# Patient Record
Sex: Male | Born: 1965 | Race: White | Hispanic: No | Marital: Married | Smoking: Never smoker
Health system: Southern US, Community
[De-identification: ages and names within clinical notes are randomized; demographics above are authoritative.]

## PROBLEM LIST (undated history)

## (undated) DIAGNOSIS — D649 Anemia, unspecified: Secondary | ICD-10-CM

## (undated) DIAGNOSIS — Z87442 Personal history of urinary calculi: Secondary | ICD-10-CM

## (undated) DIAGNOSIS — I10 Essential (primary) hypertension: Secondary | ICD-10-CM

## (undated) DIAGNOSIS — G4730 Sleep apnea, unspecified: Secondary | ICD-10-CM

## (undated) HISTORY — PX: HERNIA REPAIR: SHX51

## (undated) HISTORY — PX: SHOULDER ARTHROSCOPY: SHX128

## (undated) HISTORY — PX: PROSTATE BIOPSY: SHX241

---

## 2016-09-14 DIAGNOSIS — M109 Gout, unspecified: Secondary | ICD-10-CM | POA: Diagnosis not present

## 2017-06-04 DIAGNOSIS — G4733 Obstructive sleep apnea (adult) (pediatric): Secondary | ICD-10-CM | POA: Diagnosis not present

## 2017-07-12 DIAGNOSIS — M109 Gout, unspecified: Secondary | ICD-10-CM | POA: Diagnosis not present

## 2017-07-12 DIAGNOSIS — M25522 Pain in left elbow: Secondary | ICD-10-CM | POA: Diagnosis not present

## 2018-04-17 DIAGNOSIS — J00 Acute nasopharyngitis [common cold]: Secondary | ICD-10-CM | POA: Diagnosis not present

## 2018-10-28 ENCOUNTER — Other Ambulatory Visit (HOSPITAL_BASED_OUTPATIENT_CLINIC_OR_DEPARTMENT_OTHER): Payer: Self-pay | Admitting: Nurse Practitioner

## 2018-10-28 DIAGNOSIS — R748 Abnormal levels of other serum enzymes: Secondary | ICD-10-CM

## 2018-11-05 ENCOUNTER — Ambulatory Visit (HOSPITAL_BASED_OUTPATIENT_CLINIC_OR_DEPARTMENT_OTHER)
Admission: RE | Admit: 2018-11-05 | Discharge: 2018-11-05 | Disposition: A | Discharge status: Home / Self Care | Payer: 59 | Source: Ambulatory Visit | Attending: Nurse Practitioner | Admitting: Nurse Practitioner

## 2018-11-05 ENCOUNTER — Other Ambulatory Visit: Payer: Self-pay

## 2018-11-05 DIAGNOSIS — R748 Abnormal levels of other serum enzymes: Secondary | ICD-10-CM | POA: Diagnosis not present

## 2020-09-03 ENCOUNTER — Other Ambulatory Visit (HOSPITAL_BASED_OUTPATIENT_CLINIC_OR_DEPARTMENT_OTHER): Payer: Self-pay | Admitting: Family Medicine

## 2020-09-03 ENCOUNTER — Ambulatory Visit (HOSPITAL_BASED_OUTPATIENT_CLINIC_OR_DEPARTMENT_OTHER)
Admission: RE | Admit: 2020-09-03 | Discharge: 2020-09-03 | Disposition: A | Discharge status: Home / Self Care | Payer: Commercial Managed Care - PPO | Source: Ambulatory Visit | Attending: Family Medicine | Admitting: Family Medicine

## 2020-09-03 ENCOUNTER — Encounter (HOSPITAL_BASED_OUTPATIENT_CLINIC_OR_DEPARTMENT_OTHER): Payer: Self-pay | Admitting: Family Medicine

## 2020-09-03 ENCOUNTER — Other Ambulatory Visit: Payer: Self-pay

## 2020-09-03 DIAGNOSIS — R29810 Facial weakness: Secondary | ICD-10-CM | POA: Diagnosis not present

## 2020-09-03 MED ORDER — IOHEXOL 300 MG/ML  SOLN
75.0000 mL | Freq: Once | INTRAMUSCULAR | Status: AC | PRN
  Administered 2020-09-03: 75 mL via INTRAVENOUS

## 2020-12-02 ENCOUNTER — Ambulatory Visit (INDEPENDENT_AMBULATORY_CARE_PROVIDER_SITE_OTHER): Payer: Commercial Managed Care - PPO | Admitting: Podiatry

## 2020-12-02 ENCOUNTER — Other Ambulatory Visit: Payer: Self-pay

## 2020-12-02 DIAGNOSIS — M79674 Pain in right toe(s): Secondary | ICD-10-CM | POA: Diagnosis not present

## 2020-12-02 DIAGNOSIS — L03039 Cellulitis of unspecified toe: Secondary | ICD-10-CM

## 2020-12-02 NOTE — Patient Instructions (Signed)

## 2020-12-07 NOTE — Progress Notes (Signed)
Subjective:   Patient ID: [name], male   DOB: 55 y.o.   MRN: [id number]   HPI 55 year old male presents the office today with concerns of redness and swelling and discomfort of the big toe on the right side.  He states that the redness started after he jammed the toe under a cabinet and scraped some skin.  Eventually noticed a water blister.  He developed swelling or redness.  Originally thought there was a callus on indomethacin as well as colchicine without any improvement.  Also has been on a round of Bactrim which did not help.  There was on a prednisone taper as well as amoxicillin without significant improvement.  Still has redness and swelling to the toe.  He has not seen any purulence recently.  No other concerns.   Review of Systems  All other systems reviewed and are negative.  No past medical history on file.    No current outpatient medications on file.  Not on File       Objective:  Physical Exam  General: AAO x3, NAD  Dermatological: To the right hallux toenail with localized edema and erythema to the proximal nail fold.  There is old dried drainage noted underneath the proximal nail fold.  There is no ascending cellulitis.  The nail appears to be dystrophic.  There is no purulence.  No fluctuance or crepitation.  Vascular: Dorsalis Pedis artery and Posterior Tibial artery pedal pulses are 2/4 bilateral with immedate capillary fill time.  There is no pain with calf compression, swelling, warmth, erythema.   Neruologic: Grossly intact via light touch bilateral.  Musculoskeletal: Mild tenderness right hallux proximal nail fold.  Muscular strength 5/5 in all groups tested bilateral.  Gait: Unassisted, Nonantalgic.       Assessment:   Right hallux toenail infection     Plan:  -Treatment options discussed including all alternatives, risks, and complications -Etiology of symptoms were discussed -At this point has been on numerous antibiotics as well as  Medication not significant improvement.  At this point I recommended total nail avulsion given the infection on the proximal nail fold.  I discussion he agrees to proceed with this. -At this time, recommended total nail removal without chemical matricectomy to the right hallux due to infection. Risks and complications were discussed with the patient for which they understand and  verbally consent to the procedure. Under sterile conditions a total of 3 mL of a mixture of 2% lidocaine plain and 0.5% Marcaine plain was infiltrated in a hallux block fashion. Once anesthetized, the skin was prepped in sterile fashion. A tourniquet was then applied. Next the right hallux nail was then excised in total making sure to remove the entire nail borders. Once the nail was  Removed, the area was debrided and the underlying skin was intact. The area was irrigated and hemostasis was obtained.  A dry sterile dressing was applied. After application of the dressing the tourniquet was removed and there is found to be an immediate capillary refill time to the digit. The patient tolerated the procedure well any complications. Post procedure instructions were discussed the patient for which he verbally understood. Follow-up in one week for nail check or sooner if any problems are to arise. Discussed signs/symptoms of worsening infection and directed to call the office immediately should any occur or go directly to the emergency room. In the meantime, encouraged to call the office with any questions, concerns, changes symptoms.  Return in about 2 weeks (around 12/16/2020)  for nail check .  Vivi Barrack DPM

## 2020-12-16 ENCOUNTER — Other Ambulatory Visit: Payer: Self-pay

## 2020-12-16 ENCOUNTER — Ambulatory Visit (INDEPENDENT_AMBULATORY_CARE_PROVIDER_SITE_OTHER): Payer: Commercial Managed Care - PPO | Admitting: Podiatry

## 2020-12-16 DIAGNOSIS — L03039 Cellulitis of unspecified toe: Secondary | ICD-10-CM

## 2020-12-16 DIAGNOSIS — M79674 Pain in right toe(s): Secondary | ICD-10-CM

## 2020-12-16 NOTE — Progress Notes (Signed)
Subjective: [name] is a 55 y.o.  male returns to office today for follow up evaluation after having right total hallux nail avulsion performed.  Said he was doing well and soaking his foot in Epson salts but the area is scabbed over and not had any swelling or redness or any drainage.  During this period patient denies fevers, chills, nausea, vomiting. Denies any calf pain, chest pain, SOB.   Objective:   General: Well developed, nourished, in no acute distress, alert and oriented x3   Dermatology: Skin is warm, dry and supple bilateral.  Right hallux nail border appears to be clean, dry, with mild minimal scab present.  There is no surrounding erythema, edema, drainage/purulence. The remaining nails appear unremarkable at this time. There are no other lesions or other signs of infection present.  Neurovascular status: Intact. No lower extremity swelling; No pain with calf compression bilateral.  Musculoskeletal: No tenderness to palpation of the right hallux nail fold. Muscular strength within normal limits bilateral.   Assesement and Plan: S/p partial nail avulsion, doing well.   -Recommend still keep covered with antibiotic ointment and a Band-Aid during the day.  Can leave the area open at night. -If the area has not healed in 2 weeks, call the office for follow-up appointment, or sooner if any problems arise.  -Monitor for any signs/symptoms of infection. Call the office immediately if any occur or go directly to the emergency room. Call with any questions/concerns.  Ovid Curd, DPM

## 2021-08-12 ENCOUNTER — Encounter: Payer: Self-pay | Admitting: Podiatry

## 2021-08-12 ENCOUNTER — Ambulatory Visit (INDEPENDENT_AMBULATORY_CARE_PROVIDER_SITE_OTHER): Payer: Commercial Managed Care - PPO | Admitting: Podiatry

## 2021-08-12 DIAGNOSIS — L600 Ingrowing nail: Secondary | ICD-10-CM | POA: Diagnosis not present

## 2021-08-12 NOTE — Progress Notes (Signed)
  Subjective:  Patient ID: [name], male    DOB: Jul 08, 1965,   MRN: [id number]  Chief Complaint  Patient presents with   Ingrown Toenail     L Big toe ingrown toenail, blisters on tip of toes    56 y.o. male presents for concern of left great toe possible ingrown that has been ongoing for weeks. Relates he has tried to trim it but keeps coming back. Relates redness and pain in the toe and a blister on the tip. Has been soaking as well.  . Denies any other pedal complaints. Denies n/v/f/c.   No past medical history on file.  Objective:  Physical Exam: Vascular: DP/PT pulses 2/4 bilateral. CFT <3 seconds. Normal hair growth on digits. No edema.  Skin. No lacerations or abrasions bilateral feet. Left hallux nails bilateral borders incurvated with midl edema and erythema surrounding. No purulence.  Musculoskeletal: MMT 5/5 bilateral lower extremities in DF, PF, Inversion and Eversion. Deceased ROM in DF of ankle joint.  Neurological: Sensation intact to light touch.   Assessment:   1. Ingrown nail of great toe of left foot      Plan:  Patient was evaluated and treated and all questions answered. Patient requesting removal of ingrown nail today. Procedure below.  Discussed procedure and post procedure care and patient expressed understanding.  Will follow-up in 2 weeks for nail check or sooner if any problems arise.    Procedure:  Procedure: partial Nail Avulsion of left hallux bilateral nail border.  Surgeon: Louann Sjogren, DPM  Pre-op Dx: Ingrown toenail with and without infection Post-op: Same  Place of Surgery: Office exam room.  Indications for surgery: Painful and ingrown toenail.    The patient is requesting removal of nail with chemical matrixectomy. Risks and complications were discussed with the patient for which they understand and written consent was obtained. Under sterile conditions a total of 3 mL of  1% lidocaine plain was infiltrated in a hallux block  fashion. Once anesthetized, the skin was prepped in sterile fashion. A tourniquet was then applied. Next the bilateral aspect of hallux nail border was then sharply excised making sure to remove the entire offending nail border.  Next phenol was then applied under standard conditions and copiously irrigated. Silvadene was applied. A dry sterile dressing was applied. After application of the dressing the tourniquet was removed and there is found to be an immediate capillary refill time to the digit. The patient tolerated the procedure well without any complications. Post procedure instructions were discussed the patient for which he verbally understood. Follow-up in two weeks for nail check or sooner if any problems are to arise. Discussed signs/symptoms of infection and directed to call the office immediately should any occur or go directly to the emergency room. In the meantime, encouraged to call the office with any questions, concerns, changes symptoms.   Louann Sjogren, DPM

## 2021-08-12 NOTE — Patient Instructions (Addendum)

## 2021-09-01 ENCOUNTER — Ambulatory Visit (INDEPENDENT_AMBULATORY_CARE_PROVIDER_SITE_OTHER): Payer: Commercial Managed Care - PPO | Admitting: Podiatry

## 2021-09-01 ENCOUNTER — Encounter: Payer: Self-pay | Admitting: Podiatry

## 2021-09-01 DIAGNOSIS — L600 Ingrowing nail: Secondary | ICD-10-CM

## 2021-09-01 NOTE — Patient Instructions (Signed)

## 2021-09-01 NOTE — Progress Notes (Signed)
  Subjective:  Patient ID: [name], male    DOB: 1965-08-06,   MRN: [id number]  Chief Complaint  Patient presents with   Ingrown Toenail    Left great toe ingrown nail - patient is now concern about his right great toe     56 y.o. male presents for concern follow-up of left great ingrown nail procedure. Relates the nail is better and feels better. Has been soaking and doing neosporin. Now has concern for right great toenail having an ingrown. Wanting this evaluated.  . Denies any other pedal complaints. Denies n/v/f/c.   History reviewed. No pertinent past medical history.  Objective:  Physical Exam: Vascular: DP/PT pulses 2/4 bilateral. CFT <3 seconds. Normal hair growth on digits. No edema.  Skin. No lacerations or abrasions bilateral feet. Left hallux nail healing well. Right hallux with medial and lateral border incurvation and tenderness to medial border. Mild edema noted no erythema.  Musculoskeletal: MMT 5/5 bilateral lower extremities in DF, PF, Inversion and Eversion. Deceased ROM in DF of ankle joint.  Neurological: Sensation intact to light touch.   Assessment:   1. Ingrown right greater toenail      Plan:  Patient was evaluated and treated and all questions answered. Patient requesting removal of ingrown nail today. Procedure below.  Discussed procedure and post procedure care and patient expressed understanding.  Will follow-up in 2 weeks for nail check or sooner if any problems arise.    Procedure:  Procedure: partial Nail Avulsion of right hallux bilateral nail border.  Surgeon: Louann Sjogren, DPM  Pre-op Dx: Ingrown toenail without infection Post-op: Same  Place of Surgery: Office exam room.  Indications for surgery: Painful and ingrown toenail.    The patient is requesting removal of nail with chemical matrixectomy. Risks and complications were discussed with the patient for which they understand and written consent was obtained. Under sterile  conditions a total of 3 mL of  1% lidocaine plain was infiltrated in a hallux block fashion. Once anesthetized, the skin was prepped in sterile fashion. A tourniquet was then applied. Next the bilateral aspect of hallux nail border was then sharply excised making sure to remove the entire offending nail border.  Next phenol was then applied under standard conditions and copiously irrigated. Silvadene was applied. A dry sterile dressing was applied. After application of the dressing the tourniquet was removed and there is found to be an immediate capillary refill time to the digit. The patient tolerated the procedure well without any complications. Post procedure instructions were discussed the patient for which he verbally understood. Follow-up in two weeks for nail check or sooner if any problems are to arise. Discussed signs/symptoms of infection and directed to call the office immediately should any occur or go directly to the emergency room. In the meantime, encouraged to call the office with any questions, concerns, changes symptoms.   Louann Sjogren, DPM

## 2021-09-15 ENCOUNTER — Ambulatory Visit: Payer: Commercial Managed Care - PPO | Admitting: Podiatry

## 2022-05-01 ENCOUNTER — Other Ambulatory Visit: Payer: Self-pay | Admitting: Urology

## 2022-05-01 DIAGNOSIS — R972 Elevated prostate specific antigen [PSA]: Secondary | ICD-10-CM

## 2022-05-03 IMAGING — CT CT HEAD WO/W CM
3 of 6 series · 16 of 47 positions shown, 19 images · IV contrast (Omnipaque)
Comparison: CT head 09/03/2020

CLINICAL DATA: Left-sided numbness.

EXAM:
CT HEAD WITHOUT AND WITH CONTRAST
TECHNIQUE: Contiguous axial images were obtained from the base of the skull
through the vertex without and with intravenous contrast
CONTRAST:  75mL OMNIPAQUE IOHEXOL 300 MG/ML  SOLN

[Series 2: head wo · axial · 0.46mm/px · z∈[+809,+944]mm · 11 of 33 slices shown, 14 images]
[im 3/33  brain]
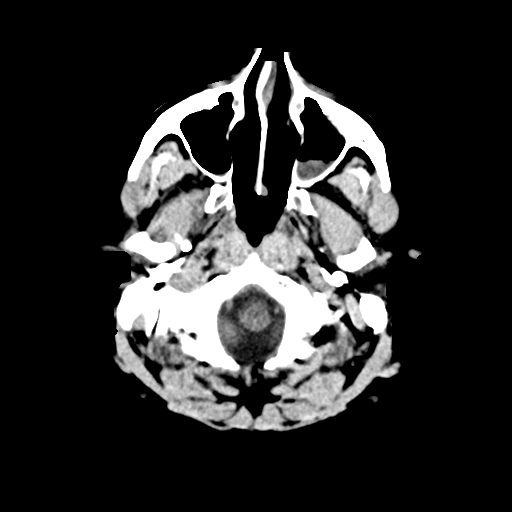
[im 3/33  bone]
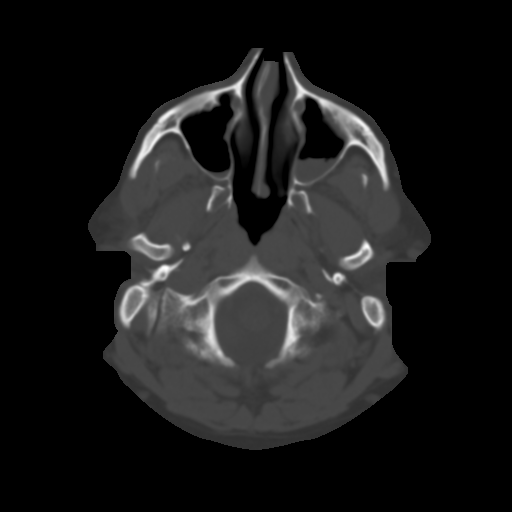
[im 5/33  brain]
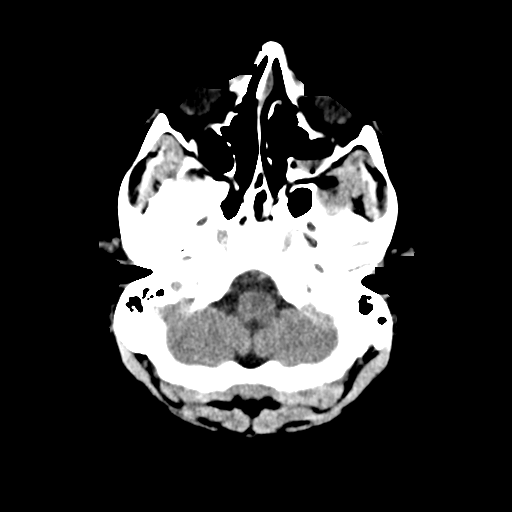
[im 7/33  brain]
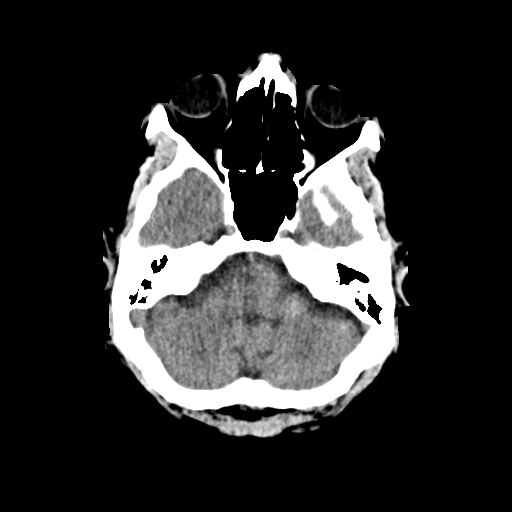
[im 12/33  brain]
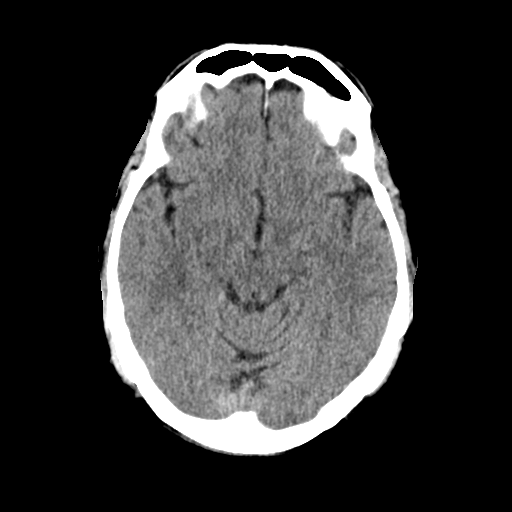
[im 14/33  brain]
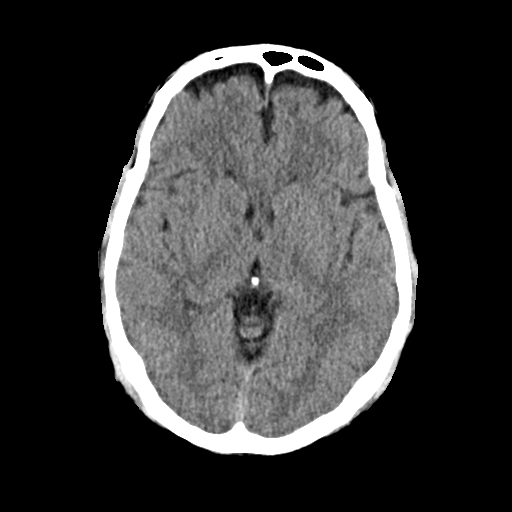
[im 14/33  bone]
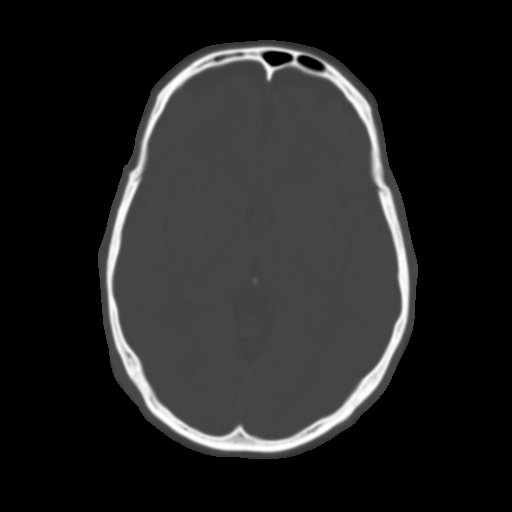
[im 17/33  brain]
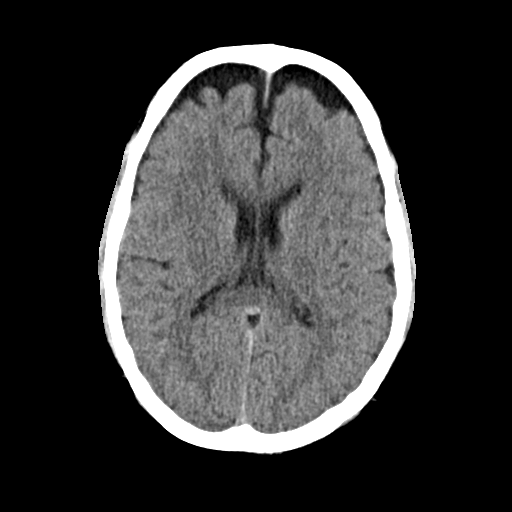
[im 19/33  brain]
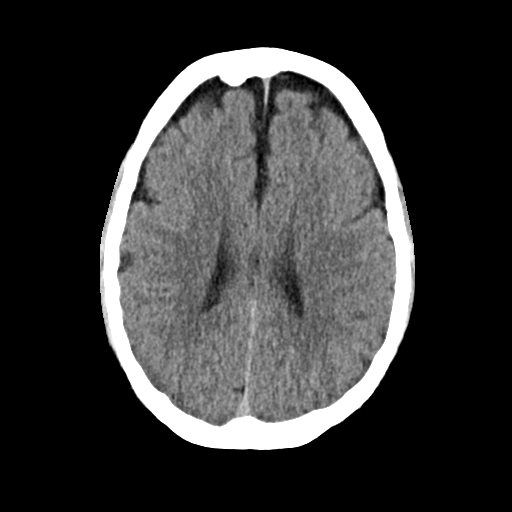
[im 21/33  brain]
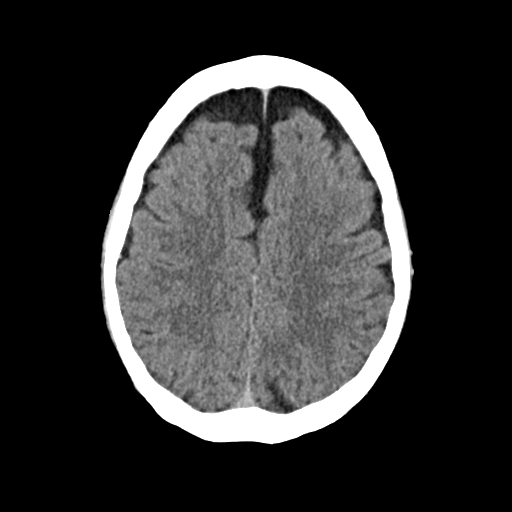
[im 26/33  brain]
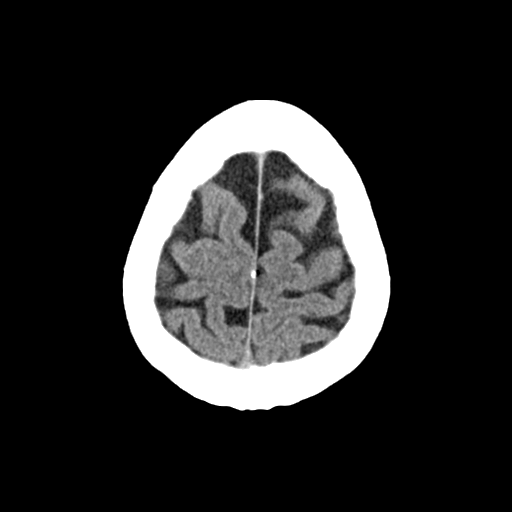
[im 26/33  bone]
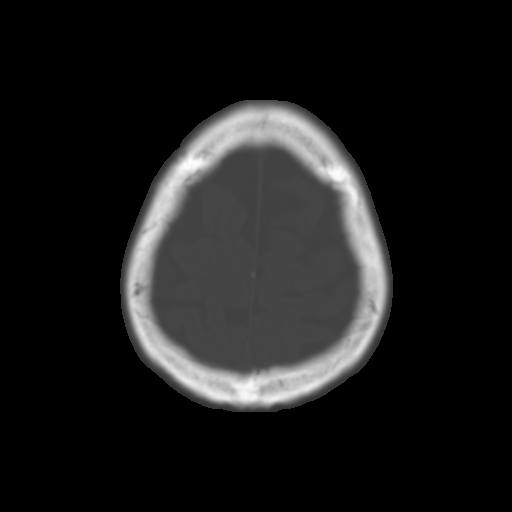
[im 28/33  brain]
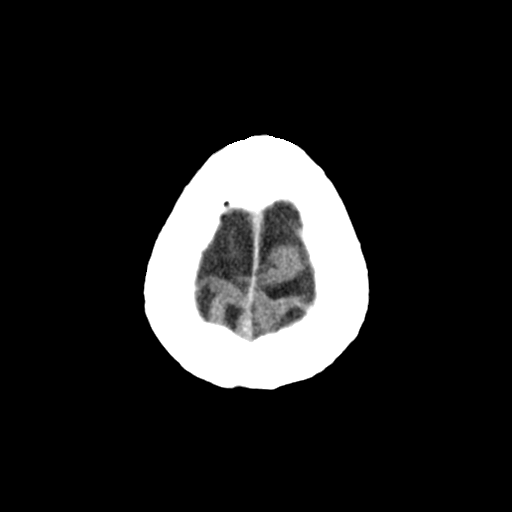
[im 30/33  brain]
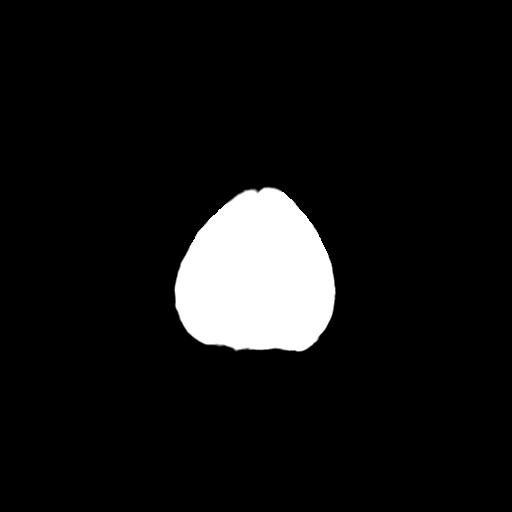

[Series 4: cor wo soft · coronal · 0.33mm/px · 3 of 71 slices shown]
[im 18/71  brain]
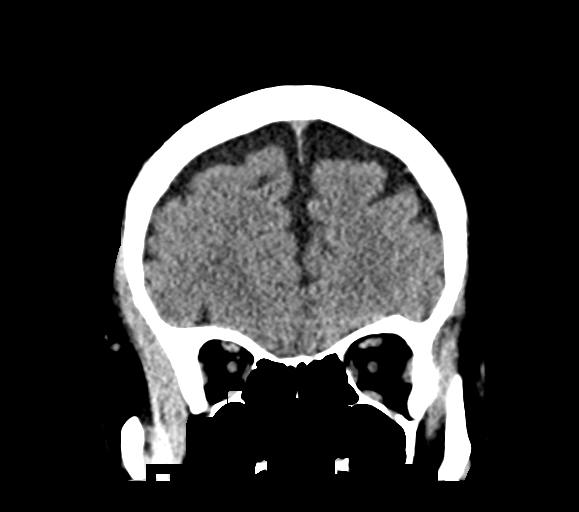
[im 36/71  brain]
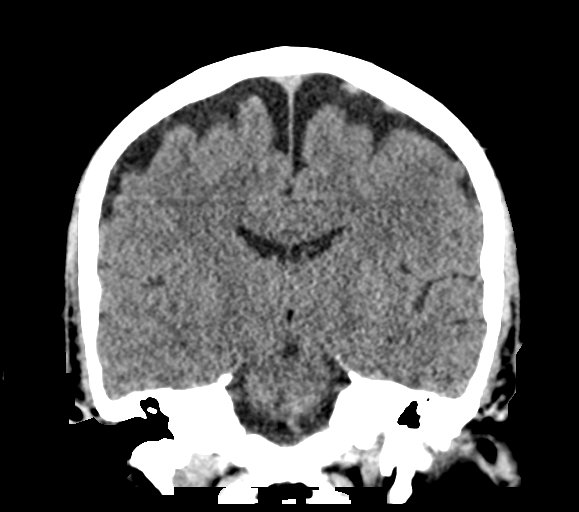
[im 53/71  brain]
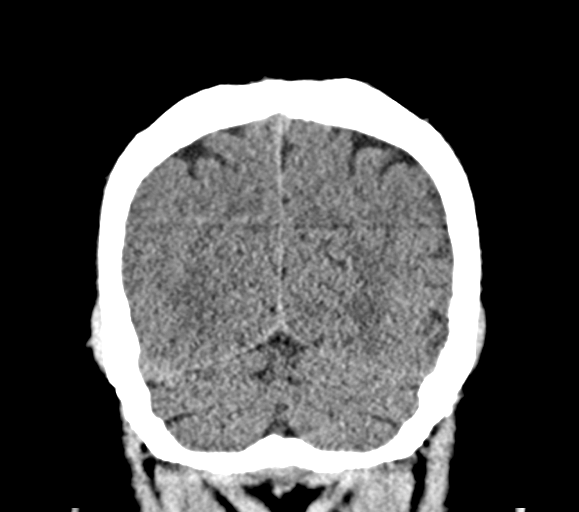

[Series 5: sag wo soft · sagittal · 0.35mm/px · 2 of 59 slices shown]
[im 20/59  brain]
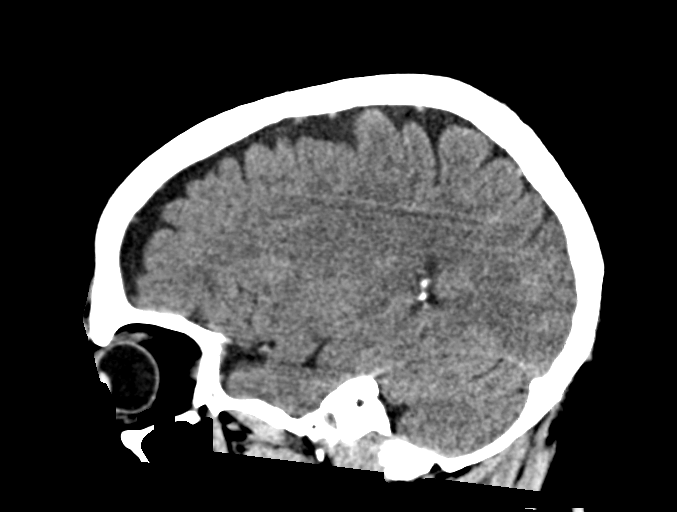
[im 39/59  brain]
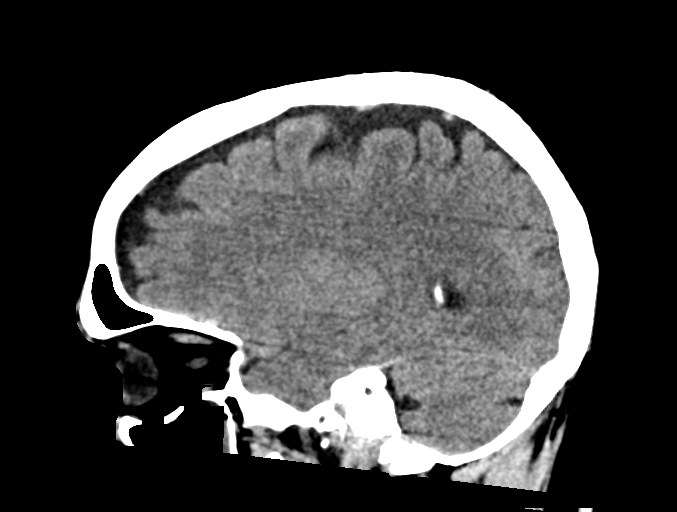

[16 of 47 positions shown; findings below may reference images not displayed]

FINDINGS: Brain: No evidence of acute infarction, hemorrhage, hydrocephalus,
extra-axial collection or mass lesion/mass effect.

Vascular: Negative for hyperdense vessel

Skull: Negative

Sinuses/Orbits: Mucosal edema and air-fluid levels in the maxillary
sinus bilaterally. Negative orbit

Other: None
IMPRESSION: Negative CT head

Mucosal edema and air-fluid levels in the maxillary sinus
bilaterally

## 2022-05-25 ENCOUNTER — Ambulatory Visit
Admission: RE | Admit: 2022-05-25 | Discharge: 2022-05-25 | Disposition: A | Discharge status: Home / Self Care | Payer: Commercial Managed Care - PPO | Source: Ambulatory Visit | Attending: Urology | Admitting: Urology

## 2022-05-25 DIAGNOSIS — R972 Elevated prostate specific antigen [PSA]: Secondary | ICD-10-CM

## 2022-05-25 MED ORDER — GADOPICLENOL 0.5 MMOL/ML IV SOLN
10.0000 mL | Freq: Once | INTRAVENOUS | Status: AC | PRN
  Administered 2022-05-25: 10 mL via INTRAVENOUS

## 2023-03-09 ENCOUNTER — Other Ambulatory Visit: Payer: Self-pay

## 2023-03-09 ENCOUNTER — Observation Stay (HOSPITAL_BASED_OUTPATIENT_CLINIC_OR_DEPARTMENT_OTHER): Payer: Commercial Managed Care - PPO | Admitting: Certified Registered Nurse Anesthetist

## 2023-03-09 ENCOUNTER — Encounter (HOSPITAL_COMMUNITY): Payer: Self-pay | Admitting: Urology

## 2023-03-09 ENCOUNTER — Encounter (HOSPITAL_COMMUNITY)
Admission: EM | Disposition: A | Discharge status: Home / Self Care | Payer: Self-pay | Source: Home / Self Care | Attending: Emergency Medicine

## 2023-03-09 ENCOUNTER — Observation Stay (HOSPITAL_COMMUNITY): Payer: Commercial Managed Care - PPO | Admitting: Certified Registered Nurse Anesthetist

## 2023-03-09 ENCOUNTER — Observation Stay (HOSPITAL_COMMUNITY)
Admission: EM | Admit: 2023-03-09 | Discharge: 2023-03-10 | Disposition: A | Discharge status: Home / Self Care | Payer: Commercial Managed Care - PPO | Attending: Family Medicine | Admitting: Family Medicine

## 2023-03-09 ENCOUNTER — Observation Stay (HOSPITAL_COMMUNITY): Payer: Commercial Managed Care - PPO

## 2023-03-09 DIAGNOSIS — R339 Retention of urine, unspecified: Principal | ICD-10-CM

## 2023-03-09 DIAGNOSIS — I10 Essential (primary) hypertension: Secondary | ICD-10-CM | POA: Diagnosis not present

## 2023-03-09 DIAGNOSIS — Z79899 Other long term (current) drug therapy: Secondary | ICD-10-CM | POA: Diagnosis not present

## 2023-03-09 DIAGNOSIS — R338 Other retention of urine: Secondary | ICD-10-CM | POA: Diagnosis not present

## 2023-03-09 DIAGNOSIS — N401 Enlarged prostate with lower urinary tract symptoms: Secondary | ICD-10-CM | POA: Insufficient documentation

## 2023-03-09 DIAGNOSIS — R310 Gross hematuria: Principal | ICD-10-CM

## 2023-03-09 HISTORY — PX: C-CATH HEMA 3 WAY 30CC COUDE-2: 108846094

## 2023-03-09 HISTORY — DX: Sleep apnea, unspecified: G47.30

## 2023-03-09 HISTORY — DX: Essential (primary) hypertension: I10

## 2023-03-09 HISTORY — PX: CYSTOSCOPY WITH FULGERATION: SHX6638

## 2023-03-09 HISTORY — PX: HAND IRRIGATION W/ NORMAL SALINE: NUR10151

## 2023-03-09 LAB — COMPREHENSIVE METABOLIC PANEL
ALT: 35 U/L (ref 0–44)
AST: 31 U/L (ref 15–41)
Albumin: 3.6 g/dL (ref 3.5–5.0)
Alkaline Phosphatase: 39 U/L (ref 38–126)
Anion gap: 9 (ref 5–15)
BUN: 18 mg/dL (ref 6–20)
CO2: 22 mmol/L (ref 22–32)
Calcium: 8.9 mg/dL (ref 8.9–10.3)
Chloride: 102 mmol/L (ref 98–111)
Creatinine, Ser: 0.83 mg/dL (ref 0.61–1.24)
GFR, Estimated: >60 mL/min (ref >60)
Glucose, Bld: 156 mg/dL — ABNORMAL HIGH (ref 70–99)
Potassium: 3.8 mmol/L (ref 3.5–5.1)
Sodium: 133 mmol/L — ABNORMAL LOW (ref 135–145)
Total Bilirubin: 0.7 mg/dL (ref <1.2)
Total Protein: 7.2 g/dL (ref 6.5–8.1)

## 2023-03-09 LAB — CBC WITH DIFFERENTIAL/PLATELET
Abs Immature Granulocytes: 0.26 10*3/uL — ABNORMAL HIGH (ref 0.00–0.07)
Basophils Absolute: 0.1 10*3/uL (ref 0.0–0.1)
Basophils Relative: 0 %
Eosinophils Absolute: 0 10*3/uL (ref 0.0–0.5)
Eosinophils Relative: 0 %
HCT: 40.7 % (ref 39.0–52.0)
Hemoglobin: 13.9 g/dL (ref 13.0–17.0)
Immature Granulocytes: 1 %
Lymphocytes Relative: 7 %
Lymphs Abs: 1.4 10*3/uL (ref 0.7–4.0)
MCH: 32.3 pg (ref 26.0–34.0)
MCHC: 34.2 g/dL (ref 30.0–36.0)
MCV: 94.7 fL (ref 80.0–100.0)
Monocytes Absolute: 0.8 10*3/uL (ref 0.1–1.0)
Monocytes Relative: 4 %
Neutro Abs: 16.8 10*3/uL — ABNORMAL HIGH (ref 1.7–7.7)
Neutrophils Relative %: 88 %
Platelets: 318 10*3/uL (ref 150–400)
RBC: 4.3 MIL/uL (ref 4.22–5.81)
RDW: 12.6 % (ref 11.5–15.5)
WBC: 19.3 10*3/uL — ABNORMAL HIGH (ref 4.0–10.5)
nRBC: 0 % (ref 0.0–0.2)

## 2023-03-09 LAB — URINALYSIS, ROUTINE W REFLEX MICROSCOPIC

## 2023-03-09 LAB — URINALYSIS, MICROSCOPIC (REFLEX)
Bacteria, UA: NONE SEEN
RBC / HPF: >50 RBC/hpf (ref 0–5)
Squamous Epithelial / HPF: NONE SEEN /[HPF] (ref 0–5)

## 2023-03-09 LAB — HEMOGLOBIN AND HEMATOCRIT, BLOOD
HCT: 31.9 % — ABNORMAL LOW (ref 39.0–52.0)
Hemoglobin: 10.8 g/dL — ABNORMAL LOW (ref 13.0–17.0)

## 2023-03-09 LAB — HIV ANTIBODY (ROUTINE TESTING W REFLEX): HIV Screen 4th Generation wRfx: NONREACTIVE

## 2023-03-09 LAB — TYPE AND SCREEN
ABO/RH(D): A POS
Antibody Screen: NEGATIVE

## 2023-03-09 LAB — ABO/RH: ABO/RH(D): A POS

## 2023-03-09 SURGERY — CYSTOSCOPY, WITH BLADDER FULGURATION
Anesthesia: General

## 2023-03-09 MED ORDER — LACTATED RINGERS IV SOLN: INTRAVENOUS | Status: DC

## 2023-03-09 MED ORDER — CHLORHEXIDINE GLUCONATE 0.12 % MT SOLN
15.0000 mL | Freq: Once | OROMUCOSAL | Status: AC
  Administered 2023-03-09: 15 mL via OROMUCOSAL

## 2023-03-09 MED ORDER — FENTANYL CITRATE (PF) 100 MCG/2ML IJ SOLN
INTRAMUSCULAR | Status: AC
  Filled 2023-03-09: qty 2

## 2023-03-09 MED ORDER — MIDAZOLAM HCL 2 MG/2ML IJ SOLN
INTRAMUSCULAR | Status: AC
  Filled 2023-03-09: qty 2

## 2023-03-09 MED ORDER — MIDAZOLAM HCL 2 MG/2ML IJ SOLN
INTRAMUSCULAR | Status: DC | PRN
  Administered 2023-03-09: 2 mg via INTRAVENOUS

## 2023-03-09 MED ORDER — HYDRALAZINE HCL 20 MG/ML IJ SOLN: 5.0000 mg | Freq: Four times a day (QID) | INTRAMUSCULAR | Status: DC | PRN

## 2023-03-09 MED ORDER — ACETAMINOPHEN 10 MG/ML IV SOLN: 1000.0000 mg | Freq: Once | INTRAVENOUS | Status: DC | PRN

## 2023-03-09 MED ORDER — PHENYLEPHRINE 80 MCG/ML (10ML) SYRINGE FOR IV PUSH (FOR BLOOD PRESSURE SUPPORT)
PREFILLED_SYRINGE | INTRAVENOUS | Status: AC
  Filled 2023-03-09: qty 10

## 2023-03-09 MED ORDER — SODIUM CHLORIDE 0.9 % IV SOLN
3.0000 g | Freq: Once | INTRAVENOUS | Status: AC
  Administered 2023-03-09: 3 g via INTRAVENOUS

## 2023-03-09 MED ORDER — FENTANYL CITRATE PF 50 MCG/ML IJ SOSY
PREFILLED_SYRINGE | INTRAMUSCULAR | Status: AC
  Administered 2023-03-09: 25 ug via INTRAVENOUS
  Filled 2023-03-09: qty 2

## 2023-03-09 MED ORDER — ONDANSETRON HCL 4 MG/2ML IJ SOLN
4.0000 mg | Freq: Four times a day (QID) | INTRAMUSCULAR | Status: DC | PRN
Start: 2023-03-09 — End: 2023-03-10

## 2023-03-09 MED ORDER — EPHEDRINE 5 MG/ML INJ
INTRAVENOUS | Status: AC
  Filled 2023-03-09: qty 5

## 2023-03-09 MED ORDER — SODIUM CHLORIDE 0.9 % IR SOLN
Status: DC | PRN
  Administered 2023-03-09: 9000 mL via INTRAVESICAL

## 2023-03-09 MED ORDER — ALBUMIN HUMAN 5 % IV SOLN: 12.5000 g | Freq: Once | INTRAVENOUS | Status: AC

## 2023-03-09 MED ORDER — PHENYLEPHRINE 80 MCG/ML (10ML) SYRINGE FOR IV PUSH (FOR BLOOD PRESSURE SUPPORT)
PREFILLED_SYRINGE | INTRAVENOUS | Status: DC | PRN
  Administered 2023-03-09 (×2): 240 ug via INTRAVENOUS
  Administered 2023-03-09: 160 ug via INTRAVENOUS
  Administered 2023-03-09: 240 ug via INTRAVENOUS
  Administered 2023-03-09: 160 ug via INTRAVENOUS

## 2023-03-09 MED ORDER — MORPHINE SULFATE (PF) 4 MG/ML IV SOLN
4.0000 mg | Freq: Once | INTRAVENOUS | Status: AC
  Administered 2023-03-09: 4 mg via INTRAVENOUS
  Filled 2023-03-09: qty 1

## 2023-03-09 MED ORDER — ORAL CARE MOUTH RINSE: 15.0000 mL | Freq: Once | OROMUCOSAL | Status: AC

## 2023-03-09 MED ORDER — DEXAMETHASONE SODIUM PHOSPHATE 10 MG/ML IJ SOLN
INTRAMUSCULAR | Status: DC | PRN
  Administered 2023-03-09: 10 mg via INTRAVENOUS

## 2023-03-09 MED ORDER — CEFAZOLIN SODIUM 1 G IJ SOLR
INTRAMUSCULAR | Status: AC
  Filled 2023-03-09: qty 10

## 2023-03-09 MED ORDER — ONDANSETRON HCL 4 MG PO TABS: 4.0000 mg | ORAL_TABLET | Freq: Four times a day (QID) | ORAL | Status: DC | PRN

## 2023-03-09 MED ORDER — SODIUM CHLORIDE 0.9 % IR SOLN
3000.0000 mL | Status: DC
  Administered 2023-03-09: 3000 mL

## 2023-03-09 MED ORDER — ACETAMINOPHEN 325 MG PO TABS
650.0000 mg | ORAL_TABLET | Freq: Four times a day (QID) | ORAL | Status: DC | PRN
  Administered 2023-03-10 (×2): 650 mg via ORAL
  Filled 2023-03-09 (×3): qty 2

## 2023-03-09 MED ORDER — ACETAMINOPHEN 10 MG/ML IV SOLN
INTRAVENOUS | Status: AC
  Administered 2023-03-09: 1000 mg via INTRAVENOUS
  Filled 2023-03-09: qty 100

## 2023-03-09 MED ORDER — CEFAZOLIN SODIUM-DEXTROSE 2-4 GM/100ML-% IV SOLN
INTRAVENOUS | Status: AC
  Filled 2023-03-09: qty 100

## 2023-03-09 MED ORDER — ALBUTEROL SULFATE (2.5 MG/3ML) 0.083% IN NEBU: 2.5000 mg | INHALATION_SOLUTION | RESPIRATORY_TRACT | Status: DC | PRN

## 2023-03-09 MED ORDER — LIDOCAINE 2% (20 MG/ML) 5 ML SYRINGE
INTRAMUSCULAR | Status: DC | PRN
  Administered 2023-03-09: 60 mg via INTRAVENOUS

## 2023-03-09 MED ORDER — FENTANYL CITRATE PF 50 MCG/ML IJ SOSY
25.0000 ug | PREFILLED_SYRINGE | INTRAMUSCULAR | Status: DC | PRN
  Administered 2023-03-09: 25 ug via INTRAVENOUS

## 2023-03-09 MED ORDER — FENTANYL CITRATE (PF) 100 MCG/2ML IJ SOLN
INTRAMUSCULAR | Status: DC | PRN
  Administered 2023-03-09 (×2): 50 ug via INTRAVENOUS

## 2023-03-09 MED ORDER — HYDROCODONE-ACETAMINOPHEN 5-325 MG PO TABS
1.0000 | ORAL_TABLET | Freq: Once | ORAL | Status: AC
  Administered 2023-03-09: 1 via ORAL
  Filled 2023-03-09: qty 1

## 2023-03-09 MED ORDER — EPHEDRINE SULFATE-NACL 50-0.9 MG/10ML-% IV SOSY
PREFILLED_SYRINGE | INTRAVENOUS | Status: DC | PRN
  Administered 2023-03-09: 10 mg via INTRAVENOUS

## 2023-03-09 MED ORDER — SODIUM CHLORIDE 0.9 % IR SOLN
3000.0000 mL | Status: DC
  Administered 2023-03-09 (×3): 3000 mL

## 2023-03-09 MED ORDER — ROSUVASTATIN CALCIUM 10 MG PO TABS
10.0000 mg | ORAL_TABLET | Freq: Every day | ORAL | Status: DC
  Administered 2023-03-09: 10 mg via ORAL
  Filled 2023-03-09: qty 1

## 2023-03-09 MED ORDER — HYDROMORPHONE HCL 1 MG/ML IJ SOLN
0.5000 mg | Freq: Once | INTRAMUSCULAR | Status: AC
  Administered 2023-03-09: 0.5 mg via INTRAVENOUS

## 2023-03-09 MED ORDER — TRAZODONE HCL 50 MG PO TABS: 50.0000 mg | ORAL_TABLET | Freq: Every evening | ORAL | Status: DC | PRN

## 2023-03-09 MED ORDER — ONDANSETRON HCL 4 MG/2ML IJ SOLN
INTRAMUSCULAR | Status: DC | PRN
  Administered 2023-03-09: 4 mg via INTRAVENOUS

## 2023-03-09 MED ORDER — TAMSULOSIN HCL 0.4 MG PO CAPS
0.4000 mg | ORAL_CAPSULE | Freq: Every day | ORAL | Status: DC
  Administered 2023-03-09 – 2023-03-10 (×2): 0.4 mg via ORAL
  Filled 2023-03-09 (×2): qty 1

## 2023-03-09 MED ORDER — DEXAMETHASONE SODIUM PHOSPHATE 10 MG/ML IJ SOLN
INTRAMUSCULAR | Status: AC
  Filled 2023-03-09: qty 1

## 2023-03-09 MED ORDER — PROPOFOL 10 MG/ML IV BOLUS
INTRAVENOUS | Status: DC | PRN
  Administered 2023-03-09: 170 mg via INTRAVENOUS

## 2023-03-09 MED ORDER — HYDROMORPHONE HCL 1 MG/ML IJ SOLN
INTRAMUSCULAR | Status: AC
  Filled 2023-03-09: qty 1

## 2023-03-09 MED ORDER — ALBUMIN HUMAN 5 % IV SOLN
INTRAVENOUS | Status: AC
  Administered 2023-03-09: 12.5 g via INTRAVENOUS
  Filled 2023-03-09: qty 250

## 2023-03-09 MED ORDER — ACETAMINOPHEN 650 MG RE SUPP: 650.0000 mg | Freq: Four times a day (QID) | RECTAL | Status: DC | PRN

## 2023-03-09 MED ORDER — STERILE WATER FOR IRRIGATION IR SOLN
Status: DC | PRN
  Administered 2023-03-09: 500 mL

## 2023-03-09 MED ORDER — LOSARTAN POTASSIUM 25 MG PO TABS: 50.0000 mg | ORAL_TABLET | Freq: Every day | ORAL | Status: DC

## 2023-03-09 SURGICAL SUPPLY — 15 items
BAG URINE DRAIN 2000ML AR STRL (UROLOGICAL SUPPLIES) IMPLANT
BAG URO CATCHER STRL LF (MISCELLANEOUS) ×1 IMPLANT
CATH HEMA 3WAY 30CC 22FR COUDE (CATHETERS) IMPLANT
DRAPE FOOT SWITCH (DRAPES) ×1 IMPLANT
ELECT REM PT RETURN 15FT ADLT (MISCELLANEOUS) ×1 IMPLANT
GLOVE SURG LX STRL 7.5 STRW (GLOVE) ×1 IMPLANT
GOWN STRL SURGICAL XL XLNG (GOWN DISPOSABLE) ×1 IMPLANT
KIT TURNOVER KIT A (KITS) IMPLANT
LOOP CUT BIPOLAR 24F LRG (ELECTROSURGICAL) IMPLANT
MANIFOLD NEPTUNE II (INSTRUMENTS) ×1 IMPLANT
PACK CYSTO (CUSTOM PROCEDURE TRAY) ×1 IMPLANT
SYR TOOMEY IRRIG 70ML (MISCELLANEOUS) ×1
SYRINGE TOOMEY IRRIG 70ML (MISCELLANEOUS) IMPLANT
TUBING CONNECTING 10 (TUBING) ×1 IMPLANT
TUBING UROLOGY SET (TUBING) ×1 IMPLANT

## 2023-03-09 NOTE — Progress Notes (Signed)
   03/09/23 2351  BiPAP/CPAP/SIPAP  Reason BIPAP/CPAP not in use Non-compliant

## 2023-03-09 NOTE — Transfer of Care (Signed)
Immediate Anesthesia Transfer of Care Note  Patient: [name]  Procedure(s) Performed: CYSTOSCOPY WITH FULGERATION, CLOT EVACUATION  Patient Location: PACU  Anesthesia Type:General  Level of Consciousness: sedated  Airway & Oxygen Therapy: Patient Spontanous Breathing and Patient connected to face mask oxygen  Post-op Assessment: Report given to RN and Post -op Vital signs reviewed and stable  Post vital signs: Reviewed and stable  Last Vitals:  Vitals Value Taken Time  BP 82/45 03/09/23 1949  Temp    Pulse 102 03/09/23 1951  Resp    SpO2 98 % 03/09/23 1951  Vitals shown include unfiled device data.  Last Pain:  Vitals:   03/09/23 1820  TempSrc:   PainSc: 2       Patients Stated Pain Goal: 2 (03/09/23 1820)  Complications: No notable events documented.

## 2023-03-09 NOTE — Anesthesia Preprocedure Evaluation (Signed)
Anesthesia Evaluation  Patient identified by MRN, date of birth, ID band Patient awake    Reviewed: Allergy & Precautions, NPO status , Patient's Chart, lab work & pertinent test results  Airway Mallampati: II  TM Distance: >3 FB Neck ROM: Full    Dental no notable dental hx.    Pulmonary neg pulmonary ROS, sleep apnea    Pulmonary exam normal        Cardiovascular hypertension, Pt. on medications  Rhythm:Regular Rate:Normal     Neuro/Psych negative neurological ROS  negative psych ROS   GI/Hepatic negative GI ROS, Neg liver ROS,,,  Endo/Other  negative endocrine ROS    Renal/GU negative Renal ROS Bladder dysfunction      Musculoskeletal negative musculoskeletal ROS (+)    Abdominal Normal abdominal exam  (+)   Peds  Hematology Lab Results      Component                Value               Date                      WBC                      19.3 (H)            03/09/2023                HGB                      13.9                03/09/2023                HCT                      40.7                03/09/2023                MCV                      94.7                03/09/2023                PLT                      318                 03/09/2023              Anesthesia Other Findings   Reproductive/Obstetrics                             Anesthesia Physical Anesthesia Plan  ASA: 2  Anesthesia Plan: General   Post-op Pain Management:    Induction: Intravenous  PONV Risk Score and Plan: 2 and Ondansetron, Dexamethasone, Midazolam and Treatment may vary due to age or medical condition  Airway Management Planned: Mask and LMA  Additional Equipment: None  Intra-op Plan:   Post-operative Plan: Extubation in OR  Informed Consent: I have reviewed the patients History and Physical, chart, labs and discussed the procedure including the risks, benefits and alternatives for the  proposed anesthesia with the patient or authorized representative who  has indicated his/her understanding and acceptance.     Dental advisory given  Plan Discussed with: CRNA  Anesthesia Plan Comments:        Anesthesia Quick Evaluation

## 2023-03-09 NOTE — Op Note (Signed)
Operative Note  Preoperative diagnosis:  1.  Clot urinary retention 2.  BPH   Postoperative diagnosis: 1.  Clot urinary retention 2.  BPH  Procedure(s): 1.  Cystoscopy with clot evacuation and fulguration   Surgeon: Rhoderick Moody, MD  Assistants:  None  Anesthesia:  General  Complications:  None  EBL: 500 mL of clot was evacuated from the lumen of the bladder  Specimens: 1.  None  Drains/Catheters: 1.  22 French three-way Foley catheter with 20 mL of sterile water in the balloon  Intraoperative findings:   Trilobar prostatic urethral obstruction with no obvious source of hematuria beyond prostate enlargement No other intravesical or urethral abnormalities were seen  Indication:  [name] is a 57 y.o. male with a history of BPH with urinary retention who attempted self-catheterization and started to experience gross hematuria which eventually led to clot urinary retention.  Despite three-way Foley catheter drainage and extensive hand irrigation, the patient's hematuria would not resolve, which prompted surgical intervention.  He has been consented for the above procedures, voices understanding and wishes to proceed.  Description of procedure:  After informed consent was obtained, the patient was brought to the operating room and general LMA anesthesia was administered. The patient was then placed in the dorsolithotomy position and prepped and draped in the usual sterile fashion. A timeout was performed.  A 26 French resectoscope with a bipolar loop working element was then advanced into the urethra and up into the bladder.  A large volume of clot burden was immediately identified.  The clot burden was then evacuated from the lumen of the bladder through the sheath of the resectoscope using a Toomey syringe.  Reinspection of the lumen of the bladder and the prostate identified no specific area of bleeding beyond just a markedly enlarged prostate.  There were no bladder  tumors or evidence of mucosal ulceration.  The prominent median lobe of the prostate and bladder neck were then extensively fulgurated until hemostasis was achieved.  The resectoscope was then removed.  A 22 French three-way Foley catheter was then replaced into the bladder with return of clear irrigant.  The Foley balloon was then inflated with 20 mL of sterile water.  The catheter was then placed to continuous bladder irrigation and set to gravity drainage.  He tolerated the procedure well and was transferred to the postanesthesia in stable condition.  Plan: Monitor on the floor overnight with CBI

## 2023-03-09 NOTE — ED Provider Notes (Signed)
[facility]PT [facility] Emergency Department Provider Note MRN:  [id number]  Arrival date & time: 03/09/23     Chief Complaint   Urinary Retention   History of Present Illness   [name] is a 57 y.o. year-old male presents to the ED with chief complaint of urine retention.  States that he had a Foley catheter placed about a week ago for UTI symptoms and enlarged prostate.  States that he had the catheter removed earlier today.  He was discharged home with some straight caths.  He used this twice, but on his third attempt he could not pass the prostate and came to the emergency department for treatment.  On arrival patient complaining of significant abdominal pain and pressure..     Review of Systems  Pertinent positive and negative review of systems noted in HPI.    Physical Exam   Vitals:   03/09/23 2030 03/09/23 2121  BP: (!) 101/57 122/68  Pulse: 95 95  Resp: 16 16  Temp:  98.2 F (36.8 C)  SpO2: 95% 96%    CONSTITUTIONAL: non toxic-appearing, NAD NEURO:  Alert and oriented x 3, CN 3-12 grossly intact EYES:  eyes equal and reactive ENT/NECK:  Supple, no stridor  CARDIO: Normal rate on my exam, regular rhythm, appears well-perfused  PULM:  No respiratory distress,  GI/GU:  non-distended, foley catheter in place with bloody urine MSK/SPINE:  No gross deformities, no edema, moves all extremities  SKIN:  no rash, atraumatic   *Additional and/or pertinent findings included in MDM below  Diagnostic and Interventional Summary    EKG Interpretation Date/Time:    Ventricular Rate:    PR Interval:    QRS Duration:    QT Interval:    QTC Calculation:   R Axis:      Text Interpretation:         Labs Reviewed  URINALYSIS, ROUTINE W REFLEX MICROSCOPIC - Abnormal; Notable for the following components:      Result Value   Color, Urine RED (*)    APPearance TURBID (*)    Glucose, UA   (*)    Value: TEST NOT REPORTED DUE TO COLOR INTERFERENCE OF  URINE PIGMENT   Hgb urine dipstick   (*)    Value: TEST NOT REPORTED DUE TO COLOR INTERFERENCE OF URINE PIGMENT   Bilirubin Urine   (*)    Value: TEST NOT REPORTED DUE TO COLOR INTERFERENCE OF URINE PIGMENT   Ketones, ur   (*)    Value: TEST NOT REPORTED DUE TO COLOR INTERFERENCE OF URINE PIGMENT   Protein, ur   (*)    Value: TEST NOT REPORTED DUE TO COLOR INTERFERENCE OF URINE PIGMENT   Nitrite   (*)    Value: TEST NOT REPORTED DUE TO COLOR INTERFERENCE OF URINE PIGMENT   Leukocytes,Ua   (*)    Value: TEST NOT REPORTED DUE TO COLOR INTERFERENCE OF URINE PIGMENT   All other components within normal limits  COMPREHENSIVE METABOLIC PANEL - Abnormal; Notable for the following components:   Sodium 133 (*)    Glucose, Bld 156 (*)    All other components within normal limits  CBC WITH DIFFERENTIAL/PLATELET - Abnormal; Notable for the following components:   WBC 19.3 (*)    Neutro Abs 16.8 (*)    Abs Immature Granulocytes 0.26 (*)    All other components within normal limits  HEMOGLOBIN AND HEMATOCRIT, BLOOD - Abnormal; Notable for the following components:   Hemoglobin 10.8 (*)  HCT 31.9 (*)    All other components within normal limits  URINALYSIS, MICROSCOPIC (REFLEX)  HIV ANTIBODY (ROUTINE TESTING W REFLEX)  BASIC METABOLIC PANEL  CBC  TYPE AND SCREEN  ABO/RH    US PELVIS LIMITED (TRANSABDOMINAL ONLY)  Final Result      Medications  rosuvastatin (CRESTOR) tablet 10 mg (10 mg Oral Given 03/09/23 2148)  tamsulosin (FLOMAX) capsule 0.4 mg ( Oral MAR Unhold 03/09/23 2127)  acetaminophen (TYLENOL) tablet 650 mg ( Oral MAR Unhold 03/09/23 2127)    Or  acetaminophen (TYLENOL) suppository 650 mg ( Rectal MAR Unhold 03/09/23 2127)  traZODone (DESYREL) tablet 50 mg ( Oral MAR Unhold 03/09/23 2127)  ondansetron (ZOFRAN) tablet 4 mg ( Oral MAR Unhold 03/09/23 2127)    Or  ondansetron (ZOFRAN) injection 4 mg ( Intravenous MAR Unhold 03/09/23 2127)  albuterol (PROVENTIL) (2.5 MG/3ML)  0.083% nebulizer solution 2.5 mg ( Nebulization MAR Unhold 03/09/23 2127)  hydrALAZINE (APRESOLINE) injection 5 mg ( Intravenous MAR Unhold 03/09/23 2127)  HYDROmorphone (DILAUDID) 1 MG/ML injection (has no administration in time range)  sodium chloride irrigation 0.9 % 3,000 mL (3,000 mLs Irrigation New Bag/Given 03/09/23 2150)  HYDROcodone-acetaminophen (NORCO/VICODIN) 5-325 MG per tablet 1 tablet (1 tablet Oral Given 03/09/23 0717)  morphine (PF) 4 MG/ML injection 4 mg (4 mg Intravenous Given 03/09/23 1206)  ceFAZolin (ANCEF) 3 g in sodium chloride 0.9 % 100 mL IVPB ( Intravenous MAR Unhold 03/09/23 2127)  chlorhexidine (PERIDEX) 0.12 % solution 15 mL (15 mLs Mouth/Throat Given 03/09/23 1735)    Or  Oral care mouth rinse ( Mouth Rinse See Alternative 03/09/23 1735)  HYDROmorphone (DILAUDID) injection 0.5 mg (0.5 mg Intravenous Given 03/09/23 1810)  albumin human 5 % solution 12.5 g (12.5 g Intravenous New Bag/Given 03/09/23 2001)     Procedures  /  Critical Care Procedures  ED Course and Medical Decision Making  I have reviewed the triage vital signs, the nursing notes, and pertinent available records from the EMR.  Social Determinants Affecting Complexity of Care: Patient has no clinically significant social determinants affecting this chief complaint..   ED Course: Clinical Course as of 03/09/23 2155  Fri Mar 09, 2023  4132 Retention after UTI, cannot self void, CBT with 3 way, consult urology [AS]  0701 BP(!): 157/72 [AS]  1158 Consulted with hospitalist who will admit [AS]    Clinical Course User Index [AS] Schutt, Edsel Petrin, PA-C    Medical Decision Making Patient here with urinary retention.  Had a catheter placed a week ago due to UTI symptoms.  Hx of enlarged prostate.  Had catheter pulled today and self cathed 2x, but failed the 3rd time.  He was very uncomfortable on arrival, but a foley catheter was placed and patient feel much better now.  The foley continues to  drain blood and clots easily.  RN has irrigated multiple times.  Discussed with Dr. Wilkie Aye.  Patient might need continuous irrigation, but will attempt to manually irrigate one more time and see if the urine clears.  Amount and/or Complexity of Data Reviewed Labs: ordered.  Risk Prescription drug management. Decision regarding hospitalization.         Consultants: Deferred to oncoming team.   Treatment and Plan: Patient signed out to oncoming team.  Plan is for urology consult.    Final Clinical Impressions(s) / ED Diagnoses     ICD-10-CM   1. Urinary retention  R33.9     2. Gross hematuria  R31.0  ED Discharge Orders     None         Discharge Instructions Discussed with and Provided to Patient:   Discharge Instructions   None      Roxy Horseman, PA-C 03/09/23 2155    Shon Baton, MD 03/10/23 [phone]

## 2023-03-09 NOTE — ED Triage Notes (Signed)
Pt states he has an enlarged prostate, had urinary rentention last week had a foley cath placed Friday a week ago, it was removed yesterday. He hasn't been able to urinate since then other than once. He has tried to self cath but only could get it to pass his prostate once around 1500.  Pt c/o extreme discomfort, is diaphoretic at time of triage.

## 2023-03-09 NOTE — ED Provider Notes (Signed)
  Physical Exam  BP 127/65 (BP Location: Right Arm)   Pulse 75   Temp 98 F (36.7 C) (Oral)   Resp 20   Ht 5\' 10"  (1.778 m)   Wt 122.5 kg   SpO2 98%   BMI 38.74 kg/m   Physical Exam Vitals and nursing note reviewed.  Constitutional:      General: He is not in acute distress.    Appearance: Normal appearance. He is normal weight. He is not ill-appearing.  HENT:     Head: Normocephalic and atraumatic.  Pulmonary:     Effort: Pulmonary effort is normal. No respiratory distress.  Abdominal:     General: Abdomen is flat.  Musculoskeletal:        General: Normal range of motion.     Cervical back: Neck supple.  Skin:    General: Skin is warm and dry.  Neurological:     Mental Status: He is alert and oriented to person, place, and time.  Psychiatric:        Mood and Affect: Mood normal.        Behavior: Behavior normal.     Procedures  Procedures  ED Course / MDM   Clinical Course as of 03/09/23 1207  Fri Mar 09, 2023  0631 Retention after UTI, cannot self void, CBT with 3 way, consult urology [AS]  0701 BP(!): 157/72 [AS]  1158 Consulted with hospitalist who will admit [AS]    Clinical Course User Index [AS] Abbegale Stehle, Edsel Petrin, PA-C   Medical Decision Making Amount and/or Complexity of Data Reviewed Labs: ordered.  Risk Prescription drug management. Decision regarding hospitalization.  Assumed care at shift change from previous provider. See previous note for full HPI. In short, 57 year old male with history of enlarged prostate presenting to the ED for evaluation of urinary retention.  He was diagnosed with UTI last Friday.  Was catheterized at that time with Foley catheter.  This was discontinued yesterday and he was given straight catheters to continue urinating.  He attempted a straight catheter 3 times at home and could not pass the catheter on the third attempt.  He has abdominal pain and pressure.  Manual irrigation in the emergency department not very  helpful.  Plan at the time to change is to reach out to urology for recommendations, likely three-way catheter and CBT.  0720- discussed with urology who will evaluate  0840-urology evaluated the patient and irrigated bladder.  States patient had a vagal episode during irrigation.  Continues to have significant amount of bleeding.  Recommends admission to hospital for overnight monitoring.  1200-discussed case with hospitalist Dr. Erenest Blank who will admit patient.  Patient informed of the plan and is in agreement.  Stable at the time of admission  Patient's case discussed with Dr. Criss Alvine who agrees with plan to admit.   Note: Portions of this report may have been transcribed using voice recognition software. Every effort was made to ensure accuracy; however, inadvertent computerized transcription errors may still be present.    Michelle Piper, Cordelia Poche 03/09/23 1207    Pricilla Loveless, MD 03/12/23 [phone]

## 2023-03-09 NOTE — Anesthesia Procedure Notes (Signed)
Procedure Name: LMA Insertion Date/Time: 03/09/2023 6:59 PM  Performed by: Elyn Peers, CRNAPre-anesthesia Checklist: Patient identified, Emergency Drugs available, Suction available, Patient being monitored and Timeout performed Patient Re-evaluated:Patient Re-evaluated prior to induction Oxygen Delivery Method: Circle system utilized Preoxygenation: Pre-oxygenation with 100% oxygen Induction Type: IV induction LMA: LMA inserted LMA Size: 4.0 Number of attempts: 1 Placement Confirmation: positive ETCO2 and breath sounds checked- equal and bilateral Tube secured with: Tape Dental Injury: Teeth and Oropharynx as per pre-operative assessment

## 2023-03-09 NOTE — ED Notes (Signed)
ED TO INPATIENT HANDOFF REPORT  ED Nurse Name and Phone #: Raynelle Fanning, RN  S Name/Age/Gender [name] 57 y.o. male Room/Bed: WA03/WA03  Code Status   Code Status: Full Code  Home/SNF/Other Home Patient oriented to: self, place, time, and situation Is this baseline? Yes   Triage Complete: Triage complete  Chief Complaint Gross hematuria [R31.0]  Triage Note Pt states he has an enlarged prostate, had urinary rentention last week had a foley cath placed Friday a week ago, it was removed yesterday. He hasn't been able to urinate since then other than once. He has tried to self cath but only could get it to pass his prostate once around 1500.  Pt c/o extreme discomfort, is diaphoretic at time of triage.    Allergies No Known Allergies  Level of Care/Admitting Diagnosis ED Disposition     ED Disposition  Admit   Condition  --   Comment  Hospital Area: [facility] [[id number]]  Level of Care: Med-Surg [16]  May place patient in observation at [facility] or [facility] if equivalent level of care is available:: No  Covid Evaluation: Asymptomatic - no recent exposure (last 10 days) testing not required  Diagnosis: Gross hematuria [599.71.ICD-9-CM]  Admitting Physician: Maryln Gottron [[id number]]  Attending Physician: Kirby Crigler, MIR [email or web address] [[id number]]          B Medical/Surgery History No past medical history on file. Past Surgical History:  Procedure Laterality Date   C-CATH HEMA 3 WAY 30CC COUDE-2  03/09/2023   HAND IRRIGATION W/ NORMAL SALINE  03/09/2023     A IV Location/Drains/Wounds Patient Lines/Drains/Airways Status     Active Line/Drains/Airways     Name Placement date Placement time Site Days   Peripheral IV 03/09/23 20 G Anterior;Left Forearm 03/09/23  1001  Forearm  less than 1   Urethral Catheter Cam Satterfield, NP Coude 22 Fr. 03/09/23  0700  Coude  less than 1            Intake/Output Last 24 hours No intake or output data  in the 24 hours ending 03/09/23 1639  Labs/Imaging Results for orders placed or performed during the hospital encounter of 03/09/23 (from the past 48 hours)  Urinalysis, Routine w reflex microscopic -Urine, Clean Catch     Status: Abnormal   Collection Time: 03/09/23  3:07 AM  Result Value Ref Range   Color, Urine RED (A) YELLOW    Comment: BIOCHEMICALS MAY BE AFFECTED BY COLOR   APPearance TURBID (A) CLEAR   Specific Gravity, Urine  1.005 - 1.030    TEST NOT REPORTED DUE TO COLOR INTERFERENCE OF URINE PIGMENT   pH  5.0 - 8.0    TEST NOT REPORTED DUE TO COLOR INTERFERENCE OF URINE PIGMENT   Glucose, UA (A) NEGATIVE mg/dL    TEST NOT REPORTED DUE TO COLOR INTERFERENCE OF URINE PIGMENT   Hgb urine dipstick (A) NEGATIVE    TEST NOT REPORTED DUE TO COLOR INTERFERENCE OF URINE PIGMENT   Bilirubin Urine (A) NEGATIVE    TEST NOT REPORTED DUE TO COLOR INTERFERENCE OF URINE PIGMENT   Ketones, ur (A) NEGATIVE mg/dL    TEST NOT REPORTED DUE TO COLOR INTERFERENCE OF URINE PIGMENT   Protein, ur (A) NEGATIVE mg/dL    TEST NOT REPORTED DUE TO COLOR INTERFERENCE OF URINE PIGMENT   Nitrite (A) NEGATIVE    TEST NOT REPORTED DUE TO COLOR INTERFERENCE OF URINE PIGMENT   Leukocytes,Ua (A) NEGATIVE    TEST NOT  REPORTED DUE TO COLOR INTERFERENCE OF URINE PIGMENT    Comment: Performed at [facility], [address] [place], [place] [address]  Urinalysis, Microscopic (reflex)     Status: None   Collection Time: 03/09/23  3:07 AM  Result Value Ref Range   RBC / HPF >50 0 - 5 RBC/hpf   WBC, UA 21-50 0 - 5 WBC/hpf   Bacteria, UA NONE SEEN NONE SEEN   Squamous Epithelial / HPF NONE SEEN 0 - 5 /HPF    Comment: Performed at [facility], [address], [place], [place] [address]  Comprehensive metabolic panel     Status: Abnormal   Collection Time: 03/09/23 10:40 AM  Result Value Ref Range   Sodium 133 (L) 135 - 145 mmol/L   Potassium 3.8 3.5 - 5.1 mmol/L    Chloride 102 98 - 111 mmol/L   CO2 22 22 - 32 mmol/L   Glucose, Bld 156 (H) 70 - 99 mg/dL    Comment: Glucose reference range applies only to samples taken after fasting for at least 8 hours.   BUN 18 6 - 20 mg/dL   Creatinine, Ser 1.91 0.61 - 1.24 mg/dL   Calcium 8.9 8.9 - 47.8 mg/dL   Total Protein 7.2 6.5 - 8.1 g/dL   Albumin 3.6 3.5 - 5.0 g/dL   AST 31 15 - 41 U/L   ALT 35 0 - 44 U/L   Alkaline Phosphatase 39 38 - 126 U/L   Total Bilirubin 0.7 <1.2 mg/dL   GFR, Estimated >29 >56 mL/min    Comment: (NOTE) Calculated using the CKD-EPI Creatinine Equation (2021)    Anion gap 9 5 - 15    Comment: Performed at [facility], [address], [place], [place] [address]  CBC with Differential     Status: Abnormal   Collection Time: 03/09/23 10:40 AM  Result Value Ref Range   WBC 19.3 (H) 4.0 - 10.5 K/uL   RBC 4.30 4.22 - 5.81 MIL/uL   Hemoglobin 13.9 13.0 - 17.0 g/dL   HCT 65.7 84.6 - 96.2 %   MCV 94.7 80.0 - 100.0 fL   MCH 32.3 26.0 - 34.0 pg   MCHC 34.2 30.0 - 36.0 g/dL   RDW 95.2 84.1 - 32.4 %   Platelets 318 150 - 400 K/uL   nRBC 0.0 0.0 - 0.2 %   Neutrophils Relative % 88 %   Neutro Abs 16.8 (H) 1.7 - 7.7 K/uL   Lymphocytes Relative 7 %   Lymphs Abs 1.4 0.7 - 4.0 K/uL   Monocytes Relative 4 %   Monocytes Absolute 0.8 0.1 - 1.0 K/uL   Eosinophils Relative 0 %   Eosinophils Absolute 0.0 0.0 - 0.5 K/uL   Basophils Relative 0 %   Basophils Absolute 0.1 0.0 - 0.1 K/uL   Immature Granulocytes 1 %   Abs Immature Granulocytes 0.26 (H) 0.00 - 0.07 K/uL    Comment: Performed at [facility], [address] [place], [place] [address]  Type and screen Ordered by PROVIDER DEFAULT     Status: None   Collection Time: 03/09/23 11:20 AM  Result Value Ref Range   ABO/RH(D) A POS    Antibody Screen NEG    Sample Expiration      03/12/2023,2359 Performed at [facility], [address] [place], [place] [address]   ABO/Rh      Status: None   Collection Time: 03/09/23 11:23 AM  Result Value Ref Range   ABO/RH(D)      A POS Performed at [facility], [address], [place], [place] [address]    No results found.  Pending Labs Unresulted Labs (From admission, onward)     Start     Ordered   03/10/23 0500  Basic metabolic panel  Tomorrow morning,   R        03/09/23 1243   03/10/23 0500  CBC  Tomorrow morning,   R        03/09/23 1243   03/09/23 1243  HIV Antibody (routine testing w rflx)  (HIV Antibody (Routine testing w reflex) panel)  Once,   R        03/09/23 1243            Vitals/Pain Today's Vitals   03/09/23 1530 03/09/23 1552 03/09/23 1600 03/09/23 1630  BP: 129/64  128/71 118/64  Pulse: 85  91 92  Resp: (!) 23  (!) 23 (!) 25  Temp:  98.4 F (36.9 C)    TempSrc:  Oral    SpO2: 97%  95% 94%  Weight:      Height:      PainSc:        Isolation Precautions No active isolations  Medications Medications  sodium chloride irrigation 0.9 % 3,000 mL (3,000 mLs Irrigation New Bag/Given 03/09/23 1100)  rosuvastatin (CRESTOR) tablet 10 mg (has no administration in time range)  tamsulosin (FLOMAX) capsule 0.4 mg (0.4 mg Oral Given 03/09/23 1301)  acetaminophen (TYLENOL) tablet 650 mg (has no administration in time range)    Or  acetaminophen (TYLENOL) suppository 650 mg (has no administration in time range)  traZODone (DESYREL) tablet 50 mg (has no administration in time range)  ondansetron (ZOFRAN) tablet 4 mg (has no administration in time range)    Or  ondansetron (ZOFRAN) injection 4 mg (has no administration in time range)  albuterol (PROVENTIL) (2.5 MG/3ML) 0.083% nebulizer solution 2.5 mg (has no administration in time range)  hydrALAZINE (APRESOLINE) injection 5 mg (has no administration in time range)  ceFAZolin (ANCEF) IVPB 2g/100 mL premix (has no administration in time range)  HYDROcodone-acetaminophen (NORCO/VICODIN) 5-325 MG per tablet 1 tablet (1 tablet  Oral Given 03/09/23 0717)  morphine (PF) 4 MG/ML injection 4 mg (4 mg Intravenous Given 03/09/23 1206)    Mobility walks     Focused Assessments Cardiac Assessment Handoff:    No results found for: "CKTOTAL", "CKMB", "CKMBINDEX", "TROPONINI" No results found for: "DDIMER" Does the Patient currently have chest pain? No    R Recommendations: See Admitting Provider Note  Report given to:   Additional Notes: Pt is A&O x 4, foley catheter placed by urologist this morning, has been irrigated with five 3000 mL of NS but will clot when it is stopped

## 2023-03-09 NOTE — Consult Note (Signed)
Urology Consult Note   Requesting Attending Physician:  Shon Baton, MD Service Providing Consult: Urology  Consulting Attending: Dr. Liliane Shi        Reason for Consult:  hematuria, clot obstruction of urine  HPI: [name] is seen in consultation for reasons noted above at the request of Horton, Mayer Masker, MD. He is known to our practicew and was seen in clininc yesterday. He is followed by Dr. Liliane Shi. Pt has a very large prostate (estimated 170g) per office note, and has had recent trouble with urinary obstruction. Voiding trial was completed yesterday and patient was educated and started on CIC. He states that he was only able to successfully complete this once and struggled to advance past the prostate.   On my arrival to the ED pt was found to be alert, oriented, and in moderate discomfort. 66f foley was in place with a bag of frank red urine. He was accompanied by his wife. Please see separate procedure note for foley exchange and hand irrigation.   ------------------  Assessment:  57 y.o. male with hematuria and colt obstruction   Recommendations: #hematuria #clot obstruction of urine  94f three-way hematuria coud placed with a liter of hand irrigation, returning estimated 400 cc of clot material  Continue CBI and hand irrigate as needed.  Titrate to roughly the color pink lemonade.  Presumably the trauma is prostatic and patient will appreciate the added benefit of tamponade and will bleed with the catheter, as well as irrigation to prevent further clot obstruction.  Patient was bearing down and presumably was having some vasovagal responses.  He became pale, with cool extremities and soft blood pressure.  Would recommend admitting for at least observation overnight for monitoring CBI.  Will see daily  Case and plan discussed with Dr. Liliane Shi, primary MD, RN  Past Medical History: No past medical history on file.  Past Surgical History:  No past surgical  history on file.  Medication: No current facility-administered medications for this encounter.   Current Outpatient Medications  Medication Sig Dispense Refill   allopurinol (ZYLOPRIM) 300 MG tablet Take 300 mg by mouth daily.     amoxicillin (AMOXIL) 500 MG tablet Take by mouth 2 (two) times daily.     amoxicillin-clavulanate (AUGMENTIN) 875-125 MG tablet SMARTSIG:1 Tablet(s) By Mouth Every 12 Hours     mupirocin ointment (BACTROBAN) 2 % Apply topically 2 (two) times daily.     sulfamethoxazole-trimethoprim (BACTRIM DS) 800-160 MG tablet Take 1 tablet by mouth 2 (two) times daily.      Allergies: Not on File  Social History:    Family History No family history on file.  Review of Systems  Genitourinary:  Positive for dysuria, hematuria and urgency.     Objective   Vital signs in last 24 hours: BP 128/73 (BP Location: Right Arm)   Pulse 75   Temp 97.7 F (36.5 C) (Oral)   Resp 20   Ht 5\' 10"  (1.778 m)   Wt 122.5 kg   SpO2 95%   BMI 38.74 kg/m   Physical Exam General: NAD, A&O, resting, appropriate HEENT: [place]/AT Pulmonary: Normal work of breathing Cardiovascular: RRR, no cyanosis Abdomen: Soft, NTTP, nondistended GU: 79f three-way hematuria coud in place.  CBI on medium gtt. with medium pink irrigant. Neuro: Appropriate, no focal neurological deficits  Most Recent Labs: No results found for: "WBC", "ADJUSTEDWBC", "HGB", "HCT", "PLT"  No results found for: "NA", "K", "CL", "CO2", "BUN", "CREATININE", "CALCIUM", "MG", "PHOS"  No results  found for: "INR", "APTT"   Urine Culture: @[id number] (laburin,org,[id number],[id number])@   IMAGING: No results found.  ------  Elmon Kirschner, NP Pager: [phone]   Please contact the urology consult pager with any further questions/concerns.

## 2023-03-09 NOTE — Procedures (Signed)
   Urology Procedure Note:   Mr. Stahlecker is a 57 year old male seen in the emergency department for clot obstruction of urine.  Please see separate consult note.  After discussing his case and proposed intervention, consent was obtained to exchange his Foley catheter for a three-way hematuria coud, hand irrigate, and initiate CBI.  Next, 63F coud was removed.  The patient was prepped and draped in the usual sterile fashion.  10 cc of lidocaine infused sterile lubricant was injected directly into the urethra.  After adequate time for analgesic effect had been provided, a 25F three-way hematuria catheter was slowly advanced into the bladder.  There was return of frank blood and clot material.  With placement is confirmed, the retention balloon was inflated with 30 cc of sterile water.  Of note almost the entire catheter was taken up in length by the large size of his prostate.  I hand irrigated with a liter of water and 50cc aliquots, returning around 400cc or so of clot material.  After bladder appeared clear the catheter was placed to gravity drainage with no dependent loops.Marland Kitchen  CBI was connected to the third port and initiated at a medium gtt. and monitor this for around 10 minutes while conversing with the family and it continue to follow well.  Elmon Kirschner, NP [facility] Pager: [phone]

## 2023-03-09 NOTE — H&P (Addendum)
History and Physical  [name] ZOX:[id number] DOB: 28-Jul-1965 DOA: 03/09/2023  PCP: [name], NP   Chief Complaint: Hematuria  HPI: [name] is a 57 y.o. male with medical history significant for hypertension, hyperlipidemia, BPH being admitted to the hospital with gross hematuria.  Patient is being followed by [facility] urology specialist, had a Foley catheter for several weeks, this was removed a couple days ago and the patient has been doing intermittent self cath since yesterday 12/19.  Did a couple times at home per his wife without any trouble, however earlier this morning about 12:30 AM, he felt that he was getting his prostate, started having some significant pain while trying to self catheterize, and then started having gross hematuria with clots earlier this morning.  Was seen in consultation by urology, they placed Foley catheter, and frank red urine was drained.  Foley catheter was exchanged, he was placed on CBI after hand irrigation.  Hospitalist consultation and admission was requested.  Review of Systems: Please see HPI for pertinent positives and negatives. A complete 10 system review of systems are otherwise negative.  No past medical history on file. Past Surgical History:  Procedure Laterality Date   C-CATH HEMA 3 WAY 30CC COUDE-2  03/09/2023   HAND IRRIGATION W/ NORMAL SALINE  03/09/2023    Social History:  has no history on file for tobacco use, alcohol use, and drug use.   No Known Allergies  No family history on file.   Prior to Admission medications   Medication Sig Start Date End Date Taking? Authorizing Provider  allopurinol (ZYLOPRIM) 300 MG tablet Take 300 mg by mouth daily. 11/19/20  Yes [provider]  losartan (COZAAR) 50 MG tablet Take 50 mg by mouth daily. 02/22/23  Yes [provider]  rosuvastatin (CRESTOR) 10 MG tablet Take 10 mg by mouth at bedtime.   Yes [provider]  Tadalafil 2.5 MG TABS Take 1 tablet  by mouth daily. 09/11/22  Yes [provider]  tamsulosin (FLOMAX) 0.4 MG CAPS capsule Take 0.4 mg by mouth daily. 08/02/22  Yes [provider]  testosterone cypionate (DEPOTESTOSTERONE CYPIONATE) 200 MG/ML injection Inject 200 mg into the muscle 2 (two) times a week. 03/03/23  Yes [provider]    Physical Exam: BP 113/64   Pulse 75   Temp 98 F (36.7 C) (Oral)   Resp 20   Ht 5\' 10"  (1.778 m)   Wt 122.5 kg   SpO2 98%   BMI 38.74 kg/m   General:  Alert, oriented, calm, in no acute distress, looks very comfortable, his wife, daughter and granddaughter are at the bedside Cardiovascular: RRR, no murmurs or rubs, no peripheral edema  Respiratory: clear to auscultation bilaterally, no wheezes, no crackles  Abdomen: soft, nontender, nondistended, normal bowel tones heard  Skin: dry, no rashes  GU: CBI in place, with pink lemonade with no clots seen Musculoskeletal: no joint effusions, normal range of motion  Psychiatric: appropriate affect, normal speech  Neurologic: extraocular muscles intact, clear speech, moving all extremities with intact sensorium         Labs on Admission:  Basic Metabolic Panel: Recent Labs  Lab 03/09/23 1040  NA 133*  K 3.8  CL 102  CO2 22  GLUCOSE 156*  BUN 18  CREATININE 0.83  CALCIUM 8.9   Liver Function Tests: Recent Labs  Lab 03/09/23 1040  AST 31  ALT 35  ALKPHOS 39  BILITOT 0.7  PROT 7.2  ALBUMIN 3.6  No results for input(s): "LIPASE", "AMYLASE" in the last 168 hours. No results for input(s): "AMMONIA" in the last 168 hours. CBC: Recent Labs  Lab 03/09/23 1040  WBC 19.3*  NEUTROABS 16.8*  HGB 13.9  HCT 40.7  MCV 94.7  PLT 318   Cardiac Enzymes: No results for input(s): "CKTOTAL", "CKMB", "CKMBINDEX", "TROPONINI" in the last 168 hours.  BNP (last 3 results) No results for input(s): "BNP" in the last 8760 hours.  ProBNP (last 3 results) No results for input(s): "PROBNP" in the last 8760  hours.  CBG: No results for input(s): "GLUCAP" in the last 168 hours.  Radiological Exams on Admission: No results found.  Assessment/Plan [name] is a 57 y.o. male with medical history significant for hypertension, hyperlipidemia, BPH being admitted to the hospital with gross hematuria.   Gross hematuria-with urinary retention on arrival, now resolved with Foley catheter and on CBI per urology.  Cause of gross hematuria is likely traumatic. -Observation admission -Avoid blood thinners -Continue CBI -Flomax daily  Hypertension-blood pressures are a little bit soft, though he is asymptomatic.  For now we will hold his home losartan, with as needed IV hydralazine  Hyperlipidemia-statin  OSA-CPAP at night  DVT prophylaxis: SCDs only    Code Status: Full Code  Consults called: None  Admission status: Observation   Time spent: 49 minutes  Analina Filla Sharlette Dense MD [facility] Pager [phone]  If 7PM-7AM, please contact night-coverage [email or web address] Password [facility]  03/09/2023, 12:43 PM

## 2023-03-09 NOTE — ED Notes (Signed)
6th bag of 3000 mL NS started but needed to be clamped due to the foley being blocked by blood clots, Blood clots manually removed with Tumi but can not get all of it to come out. Irrigation through foley still bloody.  Urologist paged.

## 2023-03-10 DIAGNOSIS — R310 Gross hematuria: Secondary | ICD-10-CM | POA: Diagnosis not present

## 2023-03-10 LAB — BASIC METABOLIC PANEL
Anion gap: 10 (ref 5–15)
BUN: 30 mg/dL — ABNORMAL HIGH (ref 6–20)
CO2: 24 mmol/L (ref 22–32)
Calcium: 8.2 mg/dL — ABNORMAL LOW (ref 8.9–10.3)
Chloride: 100 mmol/L (ref 98–111)
Creatinine, Ser: 1.6 mg/dL — ABNORMAL HIGH (ref 0.61–1.24)
GFR, Estimated: 51 mL/min — ABNORMAL LOW (ref >60)
Glucose, Bld: 163 mg/dL — ABNORMAL HIGH (ref 70–99)
Potassium: 4.7 mmol/L (ref 3.5–5.1)
Sodium: 134 mmol/L — ABNORMAL LOW (ref 135–145)

## 2023-03-10 LAB — CBC
HCT: 30 % — ABNORMAL LOW (ref 39.0–52.0)
Hemoglobin: 9.7 g/dL — ABNORMAL LOW (ref 13.0–17.0)
MCH: 32.2 pg (ref 26.0–34.0)
MCHC: 32.3 g/dL (ref 30.0–36.0)
MCV: 99.7 fL (ref 80.0–100.0)
Platelets: 247 10*3/uL (ref 150–400)
RBC: 3.01 MIL/uL — ABNORMAL LOW (ref 4.22–5.81)
RDW: 13.1 % (ref 11.5–15.5)
WBC: 36.8 10*3/uL — ABNORMAL HIGH (ref 4.0–10.5)
nRBC: 0 % (ref 0.0–0.2)

## 2023-03-10 MED ORDER — CHLORHEXIDINE GLUCONATE CLOTH 2 % EX PADS: 6.0000 | MEDICATED_PAD | Freq: Every day | CUTANEOUS | Status: DC

## 2023-03-10 NOTE — Discharge Summary (Signed)
Physician Discharge Summary  Advay [name] ZOX:[id number] DOB: [birth year] DOA: 03/09/2023  PCP: Timothy Hansen, NP  Admit date: 03/09/2023  Discharge date: 03/10/2023  Admitted From: Home  Disposition: Home.  Recommendations for Outpatient Follow-up:  Follow up with PCP in 1-2 weeks. Please obtain BMP/CBC in one week Advised to follow-up with urology as scheduled. Patient is being discharged with Foley catheter.  Home Health:None Equipment/Devices:Foley catheter  Discharge Condition: Stable CODE STATUS:Full code Diet recommendation: Heart Healthy  Brief [facility] Course: This 57 y.o. male with medical history significant for hypertension, hyperlipidemia, BPH being admitted to the hospital with gross hematuria.  Patient is being followed by [facility] urology specialist, had a Foley catheter for several weeks, this was removed a couple days ago and the patient has been doing intermittent self cath since yesterday 12/19.  Did a couple times at home per his wife without any trouble, however earlier this morning about 12:30 AM, he felt that he was getting his prostate inflammed, started having some significant pain while trying to self catheterize, and then started having gross hematuria with clots earlier this morning.  Was seen in consultation by urology, they placed Foley catheter, and frank red urine was drained.  Foley catheter was exchanged, he was placed on CBI after hand irrigation.  Hospitalist consultation and admission was requested.  Patient underwent cystoscopy with clot evacuation and fulguration tolerated well.  Hematuria resolved.  CBI discontinued.  Urology signed off recommended outpatient follow-up.  Patient feels better and wants to be discharged.  Patient is being discharged home.   Discharge Diagnoses:  Principal Problem:   Gross hematuria   Discharge Instructions  Discharge Instructions     Call MD for:  difficulty breathing, headache or visual  disturbances   Complete by: As directed    Call MD for:  persistant dizziness or light-headedness   Complete by: As directed    Call MD for:  persistant nausea and vomiting   Complete by: As directed    Diet - low sodium heart healthy   Complete by: As directed    Diet Carb Modified   Complete by: As directed    Discharge instructions   Complete by: As directed    Advised to follow-up with primary care physician in 1 week. Advised to follow-up with urology as scheduled. Patient is being discharged with a Foley catheter.   Increase activity slowly   Complete by: As directed       Allergies as of 03/10/2023   No Known Allergies      Medication List     TAKE these medications    allopurinol 300 MG tablet Commonly known as: ZYLOPRIM Take 300 mg by mouth daily.   losartan 50 MG tablet Commonly known as: COZAAR Take 50 mg by mouth daily.   rosuvastatin 10 MG tablet Commonly known as: CRESTOR Take 10 mg by mouth at bedtime.   Tadalafil 2.5 MG Tabs Take 1 tablet by mouth daily.   tamsulosin 0.4 MG Caps capsule Commonly known as: FLOMAX Take 0.4 mg by mouth daily.   testosterone cypionate 200 MG/ML injection Commonly known as: DEPOTESTOSTERONE CYPIONATE Inject 200 mg into the muscle 2 (two) times a week.        Follow-up Information     Timothy Hansen, NP Follow up in 1 week(s).   Specialty: Nurse Practitioner Contact information: [address] [place] [address] [phone]         Timothy Paci, MD Follow up in 1 week(s).  Specialty: Urology Contact information: [address] [place] [address] [place] [address] [phone]                No Known Allergies  Consultations: Urology   Procedures/Studies: US PELVIS LIMITED (TRANSABDOMINAL ONLY) Result Date: 03/09/2023 CLINICAL DATA:  Gross hematuria. EXAM: LIMITED ULTRASOUND OF PELVIS TECHNIQUE: Limited transabdominal ultrasound examination of the pelvis was performed.  COMPARISON:  03/02/2023 FINDINGS: The balloon of a Foley catheter is seen distended within a structure that shows a circumferential thick rim of soft tissue echogenicity. This may be a decompressed urinary bladder with marked wall thickening, but Foley balloon positioning within the central prostate gland cannot be excluded. IMPRESSION: 1. Provided images show what appears to be a Foley balloon contained within a structure showing a circumferential thick soft tissue rim. This could be a decompressed bladder with blood products in the lumen although Foley placement within the prostatic urethra cannot be excluded. If there is concern for Foley balloon malposition, CT imaging of the pelvis would be the study of choice to further evaluate. Electronically Signed   By: Kennith Center M.D.   On: 03/09/2023 18:18    Subjective: Patient was seen and examined at bedside.  Overnight events noted. Patient reports hematuria has resolved.  Feels better and  wants to be discharged  Discharge Exam: Vitals:   03/10/23 0532 03/10/23 0956  BP: (!) 110/54 (!) 113/59  Pulse: 74 84  Resp: 16 14  Temp: 98 F (36.7 C) 98.1 F (36.7 C)  SpO2: 96% 93%   Vitals:   03/09/23 2121 03/10/23 0145 03/10/23 0532 03/10/23 0956  BP: 122/68 109/64 (!) 110/54 (!) 113/59  Pulse: 95 78 74 84  Resp: 16 16 16 14   Temp: 98.2 F (36.8 C) 98.1 F (36.7 C) 98 F (36.7 C) 98.1 F (36.7 C)  TempSrc: Oral Oral Oral Oral  SpO2: 96% 97% 96% 93%  Weight:      Height:        General: Pt is alert, awake, not in acute distress Cardiovascular: RRR, S1/S2 +, no rubs, no gallops Respiratory: CTA bilaterally, no wheezing, no rhonchi Abdominal: Soft, NT, ND, bowel sounds + Extremities: no edema, no cyanosis    The results of significant diagnostics from this hospitalization (including imaging, microbiology, ancillary and laboratory) are listed below for reference.     Microbiology: No results found for this or any previous visit  (from the past 240 hours).   Labs: BNP (last 3 results) No results for input(s): "BNP" in the last 8760 hours. Basic Metabolic Panel: Recent Labs  Lab 03/09/23 1040 03/10/23 0418  NA 133* 134*  K 3.8 4.7  CL 102 100  CO2 22 24  GLUCOSE 156* 163*  BUN 18 30*  CREATININE 0.83 1.60*  CALCIUM 8.9 8.2*   Liver Function Tests: Recent Labs  Lab 03/09/23 1040  AST 31  ALT 35  ALKPHOS 39  BILITOT 0.7  PROT 7.2  ALBUMIN 3.6   No results for input(s): "LIPASE", "AMYLASE" in the last 168 hours. No results for input(s): "AMMONIA" in the last 168 hours. CBC: Recent Labs  Lab 03/09/23 1040 03/09/23 2135 03/10/23 0746  WBC 19.3*  --  36.8*  NEUTROABS 16.8*  --   --   HGB 13.9 10.8* 9.7*  HCT 40.7 31.9* 30.0*  MCV 94.7  --  99.7  PLT 318  --  247   Cardiac Enzymes: No results for input(s): "CKTOTAL", "CKMB", "CKMBINDEX", "TROPONINI" in the last 168 hours.  BNP: Invalid input(s): "POCBNP" CBG: No results for input(s): "GLUCAP" in the last 168 hours. D-Dimer No results for input(s): "DDIMER" in the last 72 hours. Hgb A1c No results for input(s): "HGBA1C" in the last 72 hours. Lipid Profile No results for input(s): "CHOL", "HDL", "LDLCALC", "TRIG", "CHOLHDL", "LDLDIRECT" in the last 72 hours. Thyroid function studies No results for input(s): "TSH", "T4TOTAL", "T3FREE", "THYROIDAB" in the last 72 hours.  Invalid input(s): "FREET3" Anemia work up No results for input(s): "VITAMINB12", "FOLATE", "FERRITIN", "TIBC", "IRON", "RETICCTPCT" in the last 72 hours. Urinalysis    Component Value Date/Time   COLORURINE RED (A) 03/09/2023 0307   APPEARANCEUR TURBID (A) 03/09/2023 0307   LABSPEC  03/09/2023 0307    TEST NOT REPORTED DUE TO COLOR INTERFERENCE OF URINE PIGMENT   PHURINE  03/09/2023 0307    TEST NOT REPORTED DUE TO COLOR INTERFERENCE OF URINE PIGMENT   GLUCOSEU (A) 03/09/2023 0307    TEST NOT REPORTED DUE TO COLOR INTERFERENCE OF URINE PIGMENT   HGBUR (A)  03/09/2023 0307    TEST NOT REPORTED DUE TO COLOR INTERFERENCE OF URINE PIGMENT   BILIRUBINUR (A) 03/09/2023 0307    TEST NOT REPORTED DUE TO COLOR INTERFERENCE OF URINE PIGMENT   KETONESUR (A) 03/09/2023 0307    TEST NOT REPORTED DUE TO COLOR INTERFERENCE OF URINE PIGMENT   PROTEINUR (A) 03/09/2023 0307    TEST NOT REPORTED DUE TO COLOR INTERFERENCE OF URINE PIGMENT   NITRITE (A) 03/09/2023 0307    TEST NOT REPORTED DUE TO COLOR INTERFERENCE OF URINE PIGMENT   LEUKOCYTESUR (A) 03/09/2023 0307    TEST NOT REPORTED DUE TO COLOR INTERFERENCE OF URINE PIGMENT   Sepsis Labs Recent Labs  Lab 03/09/23 1040 03/10/23 0746  WBC 19.3* 36.8*   Microbiology No results found for this or any previous visit (from the past 240 hours).   Time coordinating discharge: Over 30 minutes  SIGNED:   Willeen Niece, MD  [facility] 03/10/2023, 2:50 PM Pager   If 7PM-7AM, please contact night-coverage

## 2023-03-10 NOTE — Progress Notes (Signed)
1 Day Post-Op Subjective: Feeling much better this morning and has no complaints.  Urine has been clear overnight.  H/H stable.  Rising creatinine is likely secondary to acute blood loss  Objective: Vital signs in last 24 hours: Temp:  [98 F (36.7 C)-99.4 F (37.4 C)] 98 F (36.7 C) (12/21 0532) Pulse Rate:  [66-117] 74 (12/21 0532) Resp:  [14-25] 16 (12/21 0532) BP: (82-140)/(45-75) 110/54 (12/21 0532) SpO2:  [94 %-100 %] 96 % (12/21 0532) Weight:  [122.5 kg] 122.5 kg (12/20 1745)  Intake/Output from previous day: 12/20 0701 - 12/21 0700 In: 1157 [P.O.:240; I.V.:817; IV Piggyback:100] Out: 975 [Urine:975]  Intake/Output this shift: Total I/O In: 120 [P.O.:120] Out: 600 [Urine:600]  Physical Exam:  General: Alert and oriented GU: 22 French three-way Foley catheter in place with clear urine output  Lab Results: Recent Labs    03/09/23 1040 03/09/23 2135 03/10/23 0746  HGB 13.9 10.8* 9.7*  HCT 40.7 31.9* 30.0*   BMET Recent Labs    03/09/23 1040 03/10/23 0418  NA 133* 134*  K 3.8 4.7  CL 102 100  CO2 22 24  GLUCOSE 156* 163*  BUN 18 30*  CREATININE 0.83 1.60*  CALCIUM 8.9 8.2*     Studies/Results: US PELVIS LIMITED (TRANSABDOMINAL ONLY) Result Date: 03/09/2023 CLINICAL DATA:  Gross hematuria. EXAM: LIMITED ULTRASOUND OF PELVIS TECHNIQUE: Limited transabdominal ultrasound examination of the pelvis was performed. COMPARISON:  03/02/2023 FINDINGS: The balloon of a Foley catheter is seen distended within a structure that shows a circumferential thick rim of soft tissue echogenicity. This may be a decompressed urinary bladder with marked wall thickening, but Foley balloon positioning within the central prostate gland cannot be excluded. IMPRESSION: 1. Provided images show what appears to be a Foley balloon contained within a structure showing a circumferential thick soft tissue rim. This could be a decompressed bladder with blood products in the lumen although  Foley placement within the prostatic urethra cannot be excluded. If there is concern for Foley balloon malposition, CT imaging of the pelvis would be the study of choice to further evaluate. Electronically Signed   By: Kennith Center M.D.   On: 03/09/2023 18:18    Assessment/Plan: 57 year old male with BPH and clot urinary retention, s/p clot evacuation on 03/09/2023  -Okay for discharge with Foley catheter -Will arrange outpatient consultation with interventional radiology to discuss prostate artery embolization -Plan for follow-up for catheter exchange in 3 to 4 weeks   LOS: 0 days   Rhoderick Moody, MD [facility] Pager: [phone]  03/10/2023, 9:22 AM

## 2023-03-10 NOTE — Progress Notes (Signed)
Mobility Specialist - Progress Note   03/10/23 1155  Mobility  Activity Ambulated independently in hallway  Level of Assistance Independent  Assistive Device None  Distance Ambulated (ft) 350 ft  Range of Motion/Exercises Active  Activity Response Tolerated well  Mobility Referral Yes  Mobility visit 1 Mobility  Mobility Specialist Start Time (ACUTE ONLY) 1145  Mobility Specialist Stop Time (ACUTE ONLY) 1155  Mobility Specialist Time Calculation (min) (ACUTE ONLY) 10 min   Pt was found in bed and agreeable to ambulate. No complaints with session. At EOS returned to bed with all needs met. Call bell in reach and family in room.  Billey Chang Mobility Specialist

## 2023-03-10 NOTE — Plan of Care (Signed)

## 2023-03-10 NOTE — Progress Notes (Signed)
Patient discharged: Home with family  Via: Wheelchair   Discharge paperwork given: to patient and family  Reviewed with teach back  IV disconnected  Pt d/c with Foley, supplies and educated about leg/ Foley bag   Belongings given to patient

## 2023-03-11 NOTE — Anesthesia Postprocedure Evaluation (Signed)
Anesthesia Post Note  Patient: [name]  Procedure(s) Performed: CYSTOSCOPY WITH FULGERATION, CLOT EVACUATION     Patient location during evaluation: PACU Anesthesia Type: General Level of consciousness: awake and alert Pain management: pain level controlled Vital Signs Assessment: post-procedure vital signs reviewed and stable Respiratory status: spontaneous breathing, nonlabored ventilation, respiratory function stable and patient connected to nasal cannula oxygen Cardiovascular status: blood pressure returned to baseline and stable Postop Assessment: no apparent nausea or vomiting Anesthetic complications: no   No notable events documented.  Last Vitals:  Vitals:   03/10/23 0532 03/10/23 0956  BP: (!) 110/54 (!) 113/59  Pulse: 74 84  Resp: 16 14  Temp: 36.7 C 36.7 C  SpO2: 96% 93%    Last Pain:  Vitals:   03/10/23 1028  TempSrc:   PainSc: 3                  Kem Parcher P Chibuikem Thang

## 2023-03-12 ENCOUNTER — Encounter (HOSPITAL_COMMUNITY): Payer: Self-pay | Admitting: Urology

## 2023-03-15 ENCOUNTER — Other Ambulatory Visit: Payer: Self-pay | Admitting: Urology

## 2023-03-15 DIAGNOSIS — R339 Retention of urine, unspecified: Secondary | ICD-10-CM

## 2023-03-23 ENCOUNTER — Other Ambulatory Visit: Payer: Self-pay | Admitting: Urology

## 2023-03-23 DIAGNOSIS — R339 Retention of urine, unspecified: Secondary | ICD-10-CM

## 2023-04-02 ENCOUNTER — Other Ambulatory Visit: Payer: Self-pay | Admitting: Urology

## 2023-04-11 ENCOUNTER — Other Ambulatory Visit: Payer: Self-pay

## 2023-04-11 ENCOUNTER — Encounter (HOSPITAL_COMMUNITY): Payer: Self-pay

## 2023-04-11 ENCOUNTER — Inpatient Hospital Stay (HOSPITAL_COMMUNITY)
Admission: EM | Admit: 2023-04-11 | Discharge: 2023-04-13 | DRG: 698 | Disposition: A | Discharge status: Home / Self Care | Payer: Commercial Managed Care - PPO | Attending: Family Medicine | Admitting: Family Medicine

## 2023-04-11 DIAGNOSIS — N401 Enlarged prostate with lower urinary tract symptoms: Secondary | ICD-10-CM | POA: Diagnosis present

## 2023-04-11 DIAGNOSIS — E669 Obesity, unspecified: Secondary | ICD-10-CM | POA: Diagnosis present

## 2023-04-11 DIAGNOSIS — N390 Urinary tract infection, site not specified: Principal | ICD-10-CM | POA: Diagnosis present

## 2023-04-11 DIAGNOSIS — G4733 Obstructive sleep apnea (adult) (pediatric): Secondary | ICD-10-CM | POA: Diagnosis present

## 2023-04-11 DIAGNOSIS — E785 Hyperlipidemia, unspecified: Secondary | ICD-10-CM | POA: Diagnosis present

## 2023-04-11 DIAGNOSIS — E66812 Obesity, class 2: Secondary | ICD-10-CM | POA: Diagnosis present

## 2023-04-11 DIAGNOSIS — Z6836 Body mass index (BMI) 36.0-36.9, adult: Secondary | ICD-10-CM | POA: Diagnosis not present

## 2023-04-11 DIAGNOSIS — Z79890 Hormone replacement therapy (postmenopausal): Secondary | ICD-10-CM | POA: Diagnosis not present

## 2023-04-11 DIAGNOSIS — B9620 Unspecified Escherichia coli [E. coli] as the cause of diseases classified elsewhere: Secondary | ICD-10-CM | POA: Diagnosis present

## 2023-04-11 DIAGNOSIS — G4730 Sleep apnea, unspecified: Secondary | ICD-10-CM | POA: Diagnosis present

## 2023-04-11 DIAGNOSIS — T83518A Infection and inflammatory reaction due to other urinary catheter, initial encounter: Principal | ICD-10-CM | POA: Diagnosis present

## 2023-04-11 DIAGNOSIS — T83511A Infection and inflammatory reaction due to indwelling urethral catheter, initial encounter: Secondary | ICD-10-CM | POA: Diagnosis not present

## 2023-04-11 DIAGNOSIS — Z79899 Other long term (current) drug therapy: Secondary | ICD-10-CM

## 2023-04-11 DIAGNOSIS — R338 Other retention of urine: Secondary | ICD-10-CM | POA: Diagnosis present

## 2023-04-11 DIAGNOSIS — R509 Fever, unspecified: Secondary | ICD-10-CM | POA: Diagnosis present

## 2023-04-11 DIAGNOSIS — I10 Essential (primary) hypertension: Secondary | ICD-10-CM | POA: Diagnosis present

## 2023-04-11 DIAGNOSIS — Y846 Urinary catheterization as the cause of abnormal reaction of the patient, or of later complication, without mention of misadventure at the time of the procedure: Secondary | ICD-10-CM | POA: Diagnosis present

## 2023-04-11 DIAGNOSIS — A419 Sepsis, unspecified organism: Secondary | ICD-10-CM | POA: Diagnosis present

## 2023-04-11 LAB — COMPREHENSIVE METABOLIC PANEL
ALT: 16 U/L (ref 0–44)
AST: 19 U/L (ref 15–41)
Albumin: 3.7 g/dL (ref 3.5–5.0)
Alkaline Phosphatase: 63 U/L (ref 38–126)
Anion gap: 11 (ref 5–15)
BUN: 21 mg/dL — ABNORMAL HIGH (ref 6–20)
CO2: 20 mmol/L — ABNORMAL LOW (ref 22–32)
Calcium: 9.4 mg/dL (ref 8.9–10.3)
Chloride: 103 mmol/L (ref 98–111)
Creatinine, Ser: 1.25 mg/dL — ABNORMAL HIGH (ref 0.61–1.24)
GFR, Estimated: >60 mL/min (ref >60)
Glucose, Bld: 132 mg/dL — ABNORMAL HIGH (ref 70–99)
Potassium: 3.9 mmol/L (ref 3.5–5.1)
Sodium: 134 mmol/L — ABNORMAL LOW (ref 135–145)
Total Bilirubin: 1.1 mg/dL (ref 0.0–1.2)
Total Protein: 8.1 g/dL (ref 6.5–8.1)

## 2023-04-11 LAB — URINALYSIS, W/ REFLEX TO CULTURE (INFECTION SUSPECTED)
Bilirubin Urine: NEGATIVE
Glucose, UA: NEGATIVE mg/dL
Ketones, ur: NEGATIVE mg/dL
Nitrite: NEGATIVE
Protein, ur: >=300 mg/dL — AB
RBC / HPF: >50 RBC/hpf (ref 0–5)
Specific Gravity, Urine: 1.023 (ref 1.005–1.030)
WBC, UA: >50 WBC/hpf (ref 0–5)
pH: 5 (ref 5.0–8.0)

## 2023-04-11 LAB — CBC WITH DIFFERENTIAL/PLATELET
Abs Immature Granulocytes: 0.14 10*3/uL — ABNORMAL HIGH (ref 0.00–0.07)
Basophils Absolute: 0.1 10*3/uL (ref 0.0–0.1)
Basophils Relative: 0 %
Eosinophils Absolute: 0 10*3/uL (ref 0.0–0.5)
Eosinophils Relative: 0 %
HCT: 37.7 % — ABNORMAL LOW (ref 39.0–52.0)
Hemoglobin: 11.7 g/dL — ABNORMAL LOW (ref 13.0–17.0)
Immature Granulocytes: 1 %
Lymphocytes Relative: 8 %
Lymphs Abs: 1.6 10*3/uL (ref 0.7–4.0)
MCH: 29.3 pg (ref 26.0–34.0)
MCHC: 31 g/dL (ref 30.0–36.0)
MCV: 94.3 fL (ref 80.0–100.0)
Monocytes Absolute: 1.8 10*3/uL — ABNORMAL HIGH (ref 0.1–1.0)
Monocytes Relative: 9 %
Neutro Abs: 16.7 10*3/uL — ABNORMAL HIGH (ref 1.7–7.7)
Neutrophils Relative %: 82 %
Platelets: 316 10*3/uL (ref 150–400)
RBC: 4 MIL/uL — ABNORMAL LOW (ref 4.22–5.81)
RDW: 13.8 % (ref 11.5–15.5)
WBC: 20.3 10*3/uL — ABNORMAL HIGH (ref 4.0–10.5)
nRBC: 0 % (ref 0.0–0.2)

## 2023-04-11 LAB — I-STAT CG4 LACTIC ACID, ED
Lactic Acid, Venous: 1.4 mmol/L (ref 0.5–1.9)
Lactic Acid, Venous: 1.5 mmol/L (ref 0.5–1.9)

## 2023-04-11 MED ORDER — SODIUM CHLORIDE 0.9 % IV SOLN
2.0000 g | Freq: Once | INTRAVENOUS | Status: AC
  Administered 2023-04-11: 2 g via INTRAVENOUS
  Filled 2023-04-11: qty 20

## 2023-04-11 MED ORDER — LACTATED RINGERS IV BOLUS
500.0000 mL | Freq: Once | INTRAVENOUS | Status: AC
  Administered 2023-04-11: 500 mL via INTRAVENOUS

## 2023-04-11 MED ORDER — LACTATED RINGERS IV SOLN: INTRAVENOUS | Status: AC

## 2023-04-11 MED ORDER — FENTANYL CITRATE PF 50 MCG/ML IJ SOSY
12.5000 ug | PREFILLED_SYRINGE | INTRAMUSCULAR | Status: DC | PRN
Start: 2023-04-11 — End: 2023-04-13

## 2023-04-11 MED ORDER — SODIUM CHLORIDE 0.9 % IV SOLN
2.0000 g | Freq: Once | INTRAVENOUS | Status: AC
  Administered 2023-04-11: 2 g via INTRAVENOUS
  Filled 2023-04-11: qty 12.5

## 2023-04-11 MED ORDER — LACTATED RINGERS IV SOLN: INTRAVENOUS | Status: DC

## 2023-04-11 MED ORDER — ENOXAPARIN SODIUM 40 MG/0.4ML IJ SOSY: 40.0000 mg | PREFILLED_SYRINGE | INTRAMUSCULAR | Status: DC

## 2023-04-11 MED ORDER — POLYETHYLENE GLYCOL 3350 17 G PO PACK: 17.0000 g | PACK | Freq: Every day | ORAL | Status: DC | PRN

## 2023-04-11 MED ORDER — ACETAMINOPHEN 325 MG PO TABS
650.0000 mg | ORAL_TABLET | Freq: Four times a day (QID) | ORAL | Status: DC | PRN
  Administered 2023-04-12 – 2023-04-13 (×5): 650 mg via ORAL
  Filled 2023-04-11 (×5): qty 2

## 2023-04-11 MED ORDER — ONDANSETRON HCL 4 MG/2ML IJ SOLN: 4.0000 mg | Freq: Four times a day (QID) | INTRAMUSCULAR | Status: DC | PRN

## 2023-04-11 MED ORDER — TAMSULOSIN HCL 0.4 MG PO CAPS
0.4000 mg | ORAL_CAPSULE | Freq: Every day | ORAL | Status: DC
  Administered 2023-04-12 – 2023-04-13 (×2): 0.4 mg via ORAL
  Filled 2023-04-11 (×2): qty 1

## 2023-04-11 MED ORDER — SODIUM CHLORIDE 0.9% FLUSH
3.0000 mL | Freq: Two times a day (BID) | INTRAVENOUS | Status: DC
  Administered 2023-04-12 – 2023-04-13 (×4): 3 mL via INTRAVENOUS

## 2023-04-11 MED ORDER — OXYCODONE HCL 5 MG PO TABS
5.0000 mg | ORAL_TABLET | ORAL | Status: DC | PRN
  Administered 2023-04-12 – 2023-04-13 (×4): 5 mg via ORAL
  Filled 2023-04-11 (×4): qty 1

## 2023-04-11 MED ORDER — SODIUM CHLORIDE 0.9 % IV BOLUS
1000.0000 mL | Freq: Once | INTRAVENOUS | Status: AC
  Administered 2023-04-11: 1000 mL via INTRAVENOUS

## 2023-04-11 MED ORDER — ONDANSETRON HCL 4 MG PO TABS: 4.0000 mg | ORAL_TABLET | Freq: Four times a day (QID) | ORAL | Status: DC | PRN

## 2023-04-11 MED ORDER — ALLOPURINOL 300 MG PO TABS
300.0000 mg | ORAL_TABLET | Freq: Every day | ORAL | Status: DC
  Administered 2023-04-13: 300 mg via ORAL
  Filled 2023-04-11 (×2): qty 1

## 2023-04-11 MED ORDER — LACTATED RINGERS IV BOLUS
1000.0000 mL | Freq: Once | INTRAVENOUS | Status: AC
  Administered 2023-04-11: 1000 mL via INTRAVENOUS

## 2023-04-11 MED ORDER — ACETAMINOPHEN 650 MG RE SUPP: 650.0000 mg | Freq: Four times a day (QID) | RECTAL | Status: DC | PRN

## 2023-04-11 MED ORDER — SODIUM CHLORIDE 0.9 % IV SOLN
2.0000 g | Freq: Three times a day (TID) | INTRAVENOUS | Status: DC
  Administered 2023-04-12 – 2023-04-13 (×4): 2 g via INTRAVENOUS
  Filled 2023-04-11 (×4): qty 12.5

## 2023-04-11 MED ORDER — ROSUVASTATIN CALCIUM 10 MG PO TABS
10.0000 mg | ORAL_TABLET | Freq: Every day | ORAL | Status: DC
  Administered 2023-04-12 (×2): 10 mg via ORAL
  Filled 2023-04-11 (×2): qty 1

## 2023-04-11 NOTE — ED Triage Notes (Addendum)
Pt referred to the ER by urgent care. Pt complaining of chills, headache, fatigue, and fevers. At urgent care pt dx with UTI, and concerned for sepsis. Pts blood pressure 96/62 at urgent care. Pt had catheter changed yesterday, and scheduled for prostate cancer in feb. Pt took tylenol and ibuprofen at apprx 1630.

## 2023-04-11 NOTE — ED Provider Notes (Signed)
[place] EMERGENCY DEPARTMENT AT [facility] Provider Note   CSN: [id number] Arrival date & time: 04/11/23  1827     History  Chief Complaint  Patient presents with   Fever    [name] is a 58 y.o. male.   Fever Patient with fever lightheadedness near syncope.  Has Foley catheter due to BPH.  Due to have prostate surgery.  Had a change at urology office yesterday.  Developed fevers after.  Feels more lightheaded.  Went to urgent care and found be hypotensive.  Reportedly urinary showed UTI.  No cough.  No nausea or vomiting.  No flank pain.     Home Medications Prior to Admission medications   Medication Sig Start Date End Date Taking? Authorizing Provider  allopurinol (ZYLOPRIM) 300 MG tablet Take 300 mg by mouth daily. 11/19/20   [provider]  losartan (COZAAR) 50 MG tablet Take 50 mg by mouth daily. 02/22/23   [provider]  rosuvastatin (CRESTOR) 10 MG tablet Take 10 mg by mouth at bedtime.    [provider]  Tadalafil 2.5 MG TABS Take 1 tablet by mouth daily. 09/11/22   [provider]  tamsulosin (FLOMAX) 0.4 MG CAPS capsule Take 0.4 mg by mouth daily. 08/02/22   [provider]  testosterone cypionate (DEPOTESTOSTERONE CYPIONATE) 200 MG/ML injection Inject 200 mg into the muscle 2 (two) times a week. 03/03/23   [provider]      Allergies    Patient has no known allergies.    Review of Systems   Review of Systems  Constitutional:  Positive for fever.    Physical Exam Updated Vital Signs BP (!) 107/58   Pulse 68   Temp 97.6 F (36.4 C) (Oral)   Resp 14   Ht 5\' 10"  (1.778 m)   Wt 114.8 kg   SpO2 99%   BMI 36.30 kg/m  Physical Exam Vitals and nursing note reviewed.  Cardiovascular:     Rate and Rhythm: Normal rate.  Pulmonary:     Breath sounds: No wheezing.  Abdominal:     Tenderness: There is no abdominal tenderness.  Genitourinary:    Comments: Foley catheter in  place. Skin:    Capillary Refill: Capillary refill takes less than 2 seconds.  Neurological:     Mental Status: He is alert and oriented to person, place, and time.     ED Results / Procedures / Treatments   Labs (all labs ordered are listed, but only abnormal results are displayed) Labs Reviewed  COMPREHENSIVE METABOLIC PANEL - Abnormal; Notable for the following components:      Result Value   Sodium 134 (*)    CO2 20 (*)    Glucose, Bld 132 (*)    BUN 21 (*)    Creatinine, Ser 1.25 (*)    All other components within normal limits  CBC WITH DIFFERENTIAL/PLATELET - Abnormal; Notable for the following components:   WBC 20.3 (*)    RBC 4.00 (*)    Hemoglobin 11.7 (*)    HCT 37.7 (*)    Neutro Abs 16.7 (*)    Monocytes Absolute 1.8 (*)    Abs Immature Granulocytes 0.14 (*)    All other components within normal limits  URINALYSIS, W/ REFLEX TO CULTURE (INFECTION SUSPECTED) - Abnormal; Notable for the following components:   Color, Urine AMBER (*)    APPearance CLOUDY (*)    Hgb urine dipstick MODERATE (*)    Protein, ur >=300 (*)  Leukocytes,Ua LARGE (*)    Bacteria, UA MANY (*)    All other components within normal limits  CULTURE, BLOOD (ROUTINE X 2)  URINE CULTURE  CULTURE, BLOOD (ROUTINE X 2)  I-STAT CG4 LACTIC ACID, ED  I-STAT CG4 LACTIC ACID, ED    EKG None  Radiology No results found.  Procedures Procedures    Medications Ordered in ED Medications  lactated ringers infusion ( Intravenous New Bag/Given 04/11/23 2215)  cefTRIAXone (ROCEPHIN) 2 g in sodium chloride 0.9 % 100 mL IVPB (0 g Intravenous Stopped 04/11/23 2117)  sodium chloride 0.9 % bolus 1,000 mL (0 mLs Intravenous Stopped 04/11/23 2157)  lactated ringers bolus 1,000 mL (0 mLs Intravenous Stopped 04/11/23 2302)  lactated ringers bolus 500 mL (500 mLs Intravenous New Bag/Given 04/11/23 2214)  ceFEPIme (MAXIPIME) 2 g in sodium chloride 0.9 % 100 mL IVPB (0 g Intravenous Stopped 04/11/23 2302)     ED Course/ Medical Decision Making/ A&P                                 Medical Decision Making Amount and/or Complexity of Data Reviewed Labs: ordered.  Risk Prescription drug management.   Patient with previous fever and dizziness.  Hypotensive for at urgent care but first blood pressure here reassuring.  However on recheck it is down to 92/50.  Recheck shortly again with systolic of over 742.  However with reported fever infection syndromes and hypotension is worrisome for sepsis.  Urine here does show UTI.  Will get basic blood work.  The white count is elevated at 20 which is elevated, however less than it was with recent mission to the hospital when been up in the 30s.  Benign abdominal exam.  No flank pain.  Although his head previous kidney stones.  2 g Rocephin given.  Will give fluid bolus.  Initial lactate normal but with recurrent hypotension will recheck.  Will discuss with urology but will be medicine admission.  Discussed with Dr. Arita Miss.  With catheter recently replaced did not think that they need inpatient urology consult.  If things worsen can talk to them.  Replete blood pressures continue to be over 100.  However appears to have difficulty infusing the fluid.  Discussed with nursing.  Will admit.  Patient has had some episodes of continued hypotension.  MAP will occasionally dipped below 65.  He has not had much of the fluid infusion yet however.  Initial fluid bolus of 1 L have been ordered but had stopped flowing.  Will now add more fluids to complete up to 30/kg.  Blood pressure improving.  Repeat lactic acid continues to be normal.  However with the recurrent hypotension coverage for UTI increased to cefepime per sepsis antibiotics.  Will discuss with hospitalist for admission.  CRITICAL CARE Performed by: Benjiman Core Total critical care time: 30 minutes Critical care time was exclusive of separately billable procedures and treating other  patients. Critical care was necessary to treat or prevent imminent or life-threatening deterioration. Critical care was time spent personally by me on the following activities: development of treatment plan with patient and/or surrogate as well as nursing, discussions with consultants, evaluation of patient's response to treatment, examination of patient, obtaining history from patient or surrogate, ordering and performing treatments and interventions, ordering and review of laboratory studies, ordering and review of radiographic studies, pulse oximetry and re-evaluation of patient's condition.  Final Clinical Impression(s) / ED Diagnoses Final diagnoses:  Complicated UTI (urinary tract infection)    Rx / DC Orders ED Discharge Orders     None         Benjiman Core, MD 04/11/23 2307

## 2023-04-11 NOTE — Progress Notes (Signed)
Elink monitoring for the code sepsis protocol.  

## 2023-04-11 NOTE — H&P (Signed)
History and Physical    [name] ZOX:[id number] DOB: 20-Mar-1966 DOA: 04/11/2023  PCP: Timothy Hansen, NP   Patient coming from: Home   Chief Complaint: Fever, rigors, fatigue  HPI: [name] is a 58 y.o. male with medical history significant for hypertension, hyperlipidemia, BPH, clot urinary retention 1 month ago status post cystoscopy with clot evacuation and fulguration who now presents with fever, rigors, and fatigue.  Patient was in his usual state yesterday when he had his Foley catheter changed at the urology clinic.  Back at home, he developed severe shaking chills, was found to have a fever, and took acetaminophen with some temporary relief.  He continued to have fevers today, has been feeling fatigued, and has developed mild frontal headache but denies rhinorrhea, sore throat, cough, shortness of breath, abdominal pain, vomiting, diarrhea, or rash.  ED Course: Upon arrival to the ED, patient is found to be afebrile and saturating well on room air with normal heart rate and systolic [name] pressure in the low 90s and greater.  Labs are most notable for creatinine 1.25, WBC 20,300, and lactic acid 1.4.  [name] and urine cultures were collected in the ED and the patient was given 2.5 L IVF, Rocephin, and cefepime.  Review of Systems:  All other systems reviewed and apart from HPI, are negative.  Past Medical History:  Diagnosis Date   Hypertension    Sleep apnea     Past Surgical History:  Procedure Laterality Date   C-CATH HEMA 3 WAY 30CC COUDE-2  03/09/2023   CYSTOSCOPY WITH FULGERATION N/A 03/09/2023   Procedure: CYSTOSCOPY WITH FULGERATION, CLOT EVACUATION;  Surgeon: [name], MD;  Location: [facility];  Service: Urology;  Laterality: N/A;   HAND IRRIGATION W/ NORMAL SALINE  03/09/2023   HERNIA REPAIR     PROSTATE BIOPSY     SHOULDER ARTHROSCOPY      Social History:   reports that he has never smoked. He has never used smokeless tobacco. He reports  current alcohol use. He reports that he does not use drugs.  No Known Allergies  History reviewed. No pertinent family history.   Prior to Admission medications   Medication Sig Start Date End Date Taking? Authorizing Provider  allopurinol (ZYLOPRIM) 300 MG tablet Take 300 mg by mouth daily. 11/19/20   [provider]  losartan (COZAAR) 50 MG tablet Take 50 mg by mouth daily. 02/22/23   [provider]  rosuvastatin (CRESTOR) 10 MG tablet Take 10 mg by mouth at bedtime.    [provider]  Tadalafil 2.5 MG TABS Take 1 tablet by mouth daily. 09/11/22   [provider]  tamsulosin (FLOMAX) 0.4 MG CAPS capsule Take 0.4 mg by mouth daily. 08/02/22   [provider]  testosterone cypionate (DEPOTESTOSTERONE CYPIONATE) 200 MG/ML injection Inject 200 mg into the muscle 2 (two) times a week. 03/03/23   [provider]    Physical Exam: Vitals:   04/11/23 2215 04/11/23 2230 04/11/23 2245 04/11/23 2300  BP: 94/60 (!) 109/57 (!) 105/54 (!) 107/58  Pulse: 68 67 69 68  Resp:  14    Temp:      TempSrc:      SpO2: 98% 98% 97% 99%  Weight:      Height:        Constitutional: NAD, calm  Eyes: PERTLA, lids and conjunctivae normal ENMT: Mucous membranes are moist. Posterior pharynx clear of any exudate or lesions.   Neck: supple, no masses  Respiratory: no  wheezing, no crackles. No accessory muscle use.  Cardiovascular: S1 & S2 heard, regular rate and rhythm. No extremity edema.   Abdomen: No distension, no tenderness, soft. Bowel sounds active.  Musculoskeletal: no clubbing / cyanosis. No joint deformity upper and lower extremities. No meningismus.   Skin: no significant rashes, lesions, ulcers. Warm, dry, well-perfused. Neurologic: CN 2-12 grossly intact. Moving all extremities. Alert and oriented.  Psychiatric: Pleasant. Cooperative.    Labs and Imaging on Admission: I have personally reviewed following labs and imaging  studies  CBC: Recent Labs  Lab 04/11/23 1854  WBC 20.3*  NEUTROABS 16.7*  HGB 11.7*  HCT 37.7*  MCV 94.3  PLT 316   Basic Metabolic Panel: Recent Labs  Lab 04/11/23 1854  NA 134*  K 3.9  CL 103  CO2 20*  GLUCOSE 132*  BUN 21*  CREATININE 1.25*  CALCIUM 9.4   GFR: Estimated Creatinine Clearance: 84.7 mL/min (A) (by C-G formula based on SCr of 1.25 mg/dL (H)). Liver Function Tests: Recent Labs  Lab 04/11/23 1854  AST 19  ALT 16  ALKPHOS 63  BILITOT 1.1  PROT 8.1  ALBUMIN 3.7   No results for input(s): "LIPASE", "AMYLASE" in the last 168 hours. No results for input(s): "AMMONIA" in the last 168 hours. Coagulation Profile: No results for input(s): "INR", "PROTIME" in the last 168 hours. Cardiac Enzymes: No results for input(s): "CKTOTAL", "CKMB", "CKMBINDEX", "TROPONINI" in the last 168 hours. BNP (last 3 results) No results for input(s): "PROBNP" in the last 8760 hours. HbA1C: No results for input(s): "HGBA1C" in the last 72 hours. CBG: No results for input(s): "GLUCAP" in the last 168 hours. Lipid Profile: No results for input(s): "CHOL", "HDL", "LDLCALC", "TRIG", "CHOLHDL", "LDLDIRECT" in the last 72 hours. Thyroid Function Tests: No results for input(s): "TSH", "T4TOTAL", "FREET4", "T3FREE", "THYROIDAB" in the last 72 hours. Anemia Panel: No results for input(s): "VITAMINB12", "FOLATE", "FERRITIN", "TIBC", "IRON", "RETICCTPCT" in the last 72 hours. Urine analysis:    Component Value Date/Time   COLORURINE AMBER (A) 04/11/2023 1916   APPEARANCEUR CLOUDY (A) 04/11/2023 1916   LABSPEC 1.023 04/11/2023 1916   PHURINE 5.0 04/11/2023 1916   GLUCOSEU NEGATIVE 04/11/2023 1916   HGBUR MODERATE (A) 04/11/2023 1916   BILIRUBINUR NEGATIVE 04/11/2023 1916   KETONESUR NEGATIVE 04/11/2023 1916   PROTEINUR >=300 (A) 04/11/2023 1916   NITRITE NEGATIVE 04/11/2023 1916   LEUKOCYTESUR LARGE (A) 04/11/2023 1916   Sepsis  Labs: @LABRCNTIP (procalcitonin:4,lacticidven:4) ) Recent Results (from the past 240 hours)  Culture, [name] (routine x 2)     Status: None (Preliminary result)   Collection Time: 04/11/23  6:54 PM   Specimen: [name] LEFT HAND  Result Value Ref Range Status   Specimen Description   Final    [name] LEFT HAND Performed at [facility], [address] [place], [place] [address]    Special Requests   Final    BOTTLES DRAWN AEROBIC AND ANAEROBIC [name] Culture results may not be optimal due to an inadequate volume of [name] received in culture bottles Performed at [facility], [address] [place], [place] [address]    Culture PENDING  Incomplete   Report Status PENDING  Incomplete     Radiological Exams on Admission: No results found.  Assessment/Plan   1. UTI  - Lactate reassuringly normal  - Continue empiric antibiotic while following cultures and clinical course   2. Hypertension  - SBP 90s in ED and antihypertensives held on admission  3. Hyperlipidemia  - Crestor   4. OSA  - CPAP while sleeping     DVT prophylaxis: SCDs  Code Status: Full  Level of Care: Level of care: Progressive Family Communication: Wife and daughter at bedside   Disposition Plan:  Patient is from: home Anticipated d/c is to: Home  Anticipated d/c date is: 04/14/23  Patient currently: Pending cultures, completion of UTI treatment or transition to oral antibiotic  Consults called: None  Admission status: Inpatient     Briscoe Deutscher, MD [facility]  04/11/2023, 11:22 PM

## 2023-04-11 NOTE — Progress Notes (Signed)
Pharmacy Antibiotic Note  [name] is a 58 y.o. male admitted on 04/11/2023 with fever lightheadedness near syncope. Has Foley catheter due to BPH .  Pharmacy has been consulted to dose cefepime for UTI.  Plan: Cefepime 2gm IV q8h Follow renal function and clinical course  Height: 5\' 10"  (177.8 cm) Weight: 114.8 kg (253 lb) IBW/kg (Calculated) : 73  Temp (24hrs), Avg:97.8 F (36.6 C), Min:97.6 F (36.4 C), Max:98.2 F (36.8 C)  Recent Labs  Lab 04/11/23 1854 04/11/23 1903 04/11/23 2031  WBC 20.3*  --   --   CREATININE 1.25*  --   --   LATICACIDVEN  --  1.4 1.5    Estimated Creatinine Clearance: 84.7 mL/min (A) (by C-G formula based on SCr of 1.25 mg/dL (H)).    No Known Allergies   Thank you for allowing pharmacy to be a part of this patient's care.  Arley Phenix RPh 04/11/2023, 11:51 PM

## 2023-04-12 DIAGNOSIS — T83511A Infection and inflammatory reaction due to indwelling urethral catheter, initial encounter: Secondary | ICD-10-CM | POA: Diagnosis not present

## 2023-04-12 DIAGNOSIS — N390 Urinary tract infection, site not specified: Secondary | ICD-10-CM | POA: Diagnosis not present

## 2023-04-12 LAB — CBC
HCT: 32.4 % — ABNORMAL LOW (ref 39.0–52.0)
Hemoglobin: 10.1 g/dL — ABNORMAL LOW (ref 13.0–17.0)
MCH: 29.8 pg (ref 26.0–34.0)
MCHC: 31.2 g/dL (ref 30.0–36.0)
MCV: 95.6 fL (ref 80.0–100.0)
Platelets: 221 10*3/uL (ref 150–400)
RBC: 3.39 MIL/uL — ABNORMAL LOW (ref 4.22–5.81)
RDW: 14 % (ref 11.5–15.5)
WBC: 14.8 10*3/uL — ABNORMAL HIGH (ref 4.0–10.5)
nRBC: 0 % (ref 0.0–0.2)

## 2023-04-12 LAB — BASIC METABOLIC PANEL
Anion gap: 9 (ref 5–15)
BUN: 20 mg/dL (ref 6–20)
CO2: 24 mmol/L (ref 22–32)
Calcium: 8.5 mg/dL — ABNORMAL LOW (ref 8.9–10.3)
Chloride: 103 mmol/L (ref 98–111)
Creatinine, Ser: 1.24 mg/dL (ref 0.61–1.24)
GFR, Estimated: >60 mL/min (ref >60)
Glucose, Bld: 117 mg/dL — ABNORMAL HIGH (ref 70–99)
Potassium: 4 mmol/L (ref 3.5–5.1)
Sodium: 136 mmol/L (ref 135–145)

## 2023-04-12 MED ORDER — CHLORHEXIDINE GLUCONATE CLOTH 2 % EX PADS
6.0000 | MEDICATED_PAD | Freq: Every day | CUTANEOUS | Status: DC
  Administered 2023-04-12 – 2023-04-13 (×2): 6 via TOPICAL

## 2023-04-12 NOTE — Plan of Care (Signed)

## 2023-04-12 NOTE — Progress Notes (Addendum)
PROGRESS NOTE    [name]  OZH:[id number] DOB: [birth year] DOA: 04/11/2023 PCP: [name], NP   Brief Narrative:  HPI: [name] is a 58 y.o. male with medical history significant for hypertension, hyperlipidemia, BPH, clot urinary retention 1 month ago status post cystoscopy with clot evacuation and fulguration who now presents with fever, rigors, and fatigue.   Patient was in his usual state yesterday when he had his Foley catheter changed at the urology clinic.  Back at home, he developed severe shaking chills, was found to have a fever, and took acetaminophen with some temporary relief.  He continued to have fevers today, has been feeling fatigued, and has developed mild frontal headache but denies rhinorrhea, sore throat, cough, shortness of breath, abdominal pain, vomiting, diarrhea, or rash.   ED Course: Upon arrival to the ED, patient is found to be afebrile and saturating well on room air with normal heart rate and systolic blood pressure in the low 90s and greater.  Labs are most notable for creatinine 1.25, WBC 20,300, and lactic acid 1.4.   Blood and urine cultures were collected in the ED and the patient was given 2.5 L IVF, Rocephin, and cefepime.  Assessment & Plan:   Principal Problem:   Catheter-associated urinary tract infection (HCC) Active Problems:   Sleep apnea   Hypertension   Hyperlipidemia  History of BPH here with catheter associated UTI: Patient started symptoms of infection/fatigue and fever post urinary catheter exchanged in urology office.  Continue cefepime and follow culture and tailor antibiotics.  Patient still febrile with last temperature spike at 5 AM today.  Continue Flomax.  Catheter exchanged on the day of admission/2 days ago, no need to replace catheter.  Hypertension: Hypotensive upon arrival.  Blood pressure improving but still on the low side.  Continue to hold antihypertensives for now.  Hyperlipidemia: Continue  Crestor.  Obstructive sleep apnea: CPAP while sleeping.  DVT prophylaxis: SCDs Start: 04/11/23 2325   Code Status: Full Code  Family Communication:  None present at bedside.  Plan of care discussed with patient in length and he/she verbalized understanding and agreed with it.  Status is: Inpatient Remains inpatient appropriate because: Still febrile this morning.   Estimated body mass index is 36.3 kg/m as calculated from the following:   Height as of this encounter: 5\' 10"  (1.778 m).   Weight as of this encounter: 114.8 kg.    Nutritional Assessment: Body mass index is 36.3 kg/m.Marland Kitchen Seen by dietician.  I agree with the assessment and plan as outlined below: Nutrition Status:        . Skin Assessment: I have examined the patient's skin and I agree with the wound assessment as performed by the wound care RN as outlined below:    Consultants:  None  Procedures:  None  Antimicrobials:  Anti-infectives (From admission, onward)    Start     Dose/Rate Route Frequency Ordered Stop   04/12/23 0600  ceFEPIme (MAXIPIME) 2 g in sodium chloride 0.9 % 100 mL IVPB        2 g 200 mL/hr over 30 Minutes Intravenous Every 8 hours 04/11/23 2328     04/11/23 2200  ceFEPIme (MAXIPIME) 2 g in sodium chloride 0.9 % 100 mL IVPB        2 g 200 mL/hr over 30 Minutes Intravenous  Once 04/11/23 2152 04/11/23 2302   04/11/23 2000  cefTRIAXone (ROCEPHIN) 2 g in sodium chloride 0.9 % 100 mL IVPB  2 g 200 mL/hr over 30 Minutes Intravenous  Once 04/11/23 1951 04/11/23 2117         Subjective: Patient seen and examined.  Feels much better today.  No complaints.  Objective: Vitals:   04/12/23 0330 04/12/23 0500 04/12/23 0630 04/12/23 0715  BP: 125/65 134/67 119/61 109/60  Pulse: 83 92 97 84  Resp: 14 16  16   Temp:  (!) 101.3 F (38.5 C)    TempSrc:  Oral    SpO2: 96% 97% 93% 94%  Weight:      Height:       No intake or output data in the 24 hours ending 04/12/23 0818 Filed  Weights   04/11/23 1836  Weight: 114.8 kg    Examination:  General exam: Appears calm and comfortable  Respiratory system: Clear to auscultation. Respiratory effort normal. Cardiovascular system: S1 & S2 heard, RRR. No JVD, murmurs, rubs, gallops or clicks. No pedal edema. Gastrointestinal system: Abdomen is nondistended, soft and nontender. No organomegaly or masses felt. Normal bowel sounds heard. Central nervous system: Alert and oriented. No focal neurological deficits. Extremities: Symmetric 5 x 5 power. Skin: No rashes, lesions or ulcers Psychiatry: Judgement and insight appear normal. Mood & affect appropriate.    Data Reviewed: I have personally reviewed following labs and imaging studies  CBC: Recent Labs  Lab 04/11/23 1854 04/12/23 0502  WBC 20.3* 14.8*  NEUTROABS 16.7*  --   HGB 11.7* 10.1*  HCT 37.7* 32.4*  MCV 94.3 95.6  PLT 316 221   Basic Metabolic Panel: Recent Labs  Lab 04/11/23 1854 04/12/23 0502  NA 134* 136  K 3.9 4.0  CL 103 103  CO2 20* 24  GLUCOSE 132* 117*  BUN 21* 20  CREATININE 1.25* 1.24  CALCIUM 9.4 8.5*   GFR: Estimated Creatinine Clearance: 85.4 mL/min (by C-G formula based on SCr of 1.24 mg/dL). Liver Function Tests: Recent Labs  Lab 04/11/23 1854  AST 19  ALT 16  ALKPHOS 63  BILITOT 1.1  PROT 8.1  ALBUMIN 3.7   No results for input(s): "LIPASE", "AMYLASE" in the last 168 hours. No results for input(s): "AMMONIA" in the last 168 hours. Coagulation Profile: No results for input(s): "INR", "PROTIME" in the last 168 hours. Cardiac Enzymes: No results for input(s): "CKTOTAL", "CKMB", "CKMBINDEX", "TROPONINI" in the last 168 hours. BNP (last 3 results) No results for input(s): "PROBNP" in the last 8760 hours. HbA1C: No results for input(s): "HGBA1C" in the last 72 hours. CBG: No results for input(s): "GLUCAP" in the last 168 hours. Lipid Profile: No results for input(s): "CHOL", "HDL", "LDLCALC", "TRIG", "CHOLHDL",  "LDLDIRECT" in the last 72 hours. Thyroid Function Tests: No results for input(s): "TSH", "T4TOTAL", "FREET4", "T3FREE", "THYROIDAB" in the last 72 hours. Anemia Panel: No results for input(s): "VITAMINB12", "FOLATE", "FERRITIN", "TIBC", "IRON", "RETICCTPCT" in the last 72 hours. Sepsis Labs: Recent Labs  Lab 04/11/23 1903 04/11/23 2031  LATICACIDVEN 1.4 1.5    Recent Results (from the past 240 hours)  Culture, blood (routine x 2)     Status: None (Preliminary result)   Collection Time: 04/11/23  6:54 PM   Specimen: BLOOD LEFT HAND  Result Value Ref Range Status   Specimen Description   Final    BLOOD LEFT HAND Performed at [facility], [address] [place], [place] [address]    Special Requests   Final    BOTTLES DRAWN AEROBIC AND ANAEROBIC Blood Culture results may not be optimal due to an inadequate  volume of blood received in culture bottles Performed at [facility], [address], [place], [place] [address]    Culture PENDING  Incomplete   Report Status PENDING  Incomplete     Radiology Studies: No results found.  Scheduled Meds:  allopurinol  300 mg Oral Daily   rosuvastatin  10 mg Oral QHS   sodium chloride flush  3 mL Intravenous Q12H   tamsulosin  0.4 mg Oral Daily   Continuous Infusions:  ceFEPime (MAXIPIME) IV Stopped (04/12/23 0754)     LOS: 1 day   Hughie Closs, MD [facility]  04/12/2023, 8:18 AM   *Please note that this is a verbal dictation therefore any spelling or grammatical errors are due to the "[employer] One" system interpretation.  Please page via Amion and do not message via secure chat for urgent patient care matters. Secure chat can be used for non urgent patient care matters.  How to contact the [facility] Attending or Consulting provider 7A - 7P or covering provider during after hours 7P -7A, for this patient?  Check the care team in [facility] and look for a) attending/consulting [facility] provider listed and b)  the [facility] team listed. Page or secure chat 7A-7P. Log into [email or web address] and use [place]'s universal password to access. If you do not have the password, please contact the hospital operator. Locate the [facility] provider you are looking for under [facility] and page to a number that you can be directly reached. If you still have difficulty reaching the provider, please page the [facility] (Director on Call) for the Hospitalists listed on amion for assistance.

## 2023-04-12 NOTE — Progress Notes (Signed)
   04/12/23 2113  BiPAP/CPAP/SIPAP  BiPAP/CPAP/SIPAP Pt Type Adult  Reason BIPAP/CPAP not in use Non-compliant (Pt refusing cpap for the night.)

## 2023-04-13 DIAGNOSIS — N390 Urinary tract infection, site not specified: Secondary | ICD-10-CM | POA: Diagnosis not present

## 2023-04-13 DIAGNOSIS — T83511A Infection and inflammatory reaction due to indwelling urethral catheter, initial encounter: Secondary | ICD-10-CM | POA: Diagnosis not present

## 2023-04-13 LAB — BASIC METABOLIC PANEL
Anion gap: 8 (ref 5–15)
BUN: 15 mg/dL (ref 6–20)
CO2: 23 mmol/L (ref 22–32)
Calcium: 8.3 mg/dL — ABNORMAL LOW (ref 8.9–10.3)
Chloride: 101 mmol/L (ref 98–111)
Creatinine, Ser: 0.84 mg/dL (ref 0.61–1.24)
GFR, Estimated: >60 mL/min (ref >60)
Glucose, Bld: 109 mg/dL — ABNORMAL HIGH (ref 70–99)
Potassium: 3.5 mmol/L (ref 3.5–5.1)
Sodium: 132 mmol/L — ABNORMAL LOW (ref 135–145)

## 2023-04-13 LAB — URINE CULTURE: Culture: >=100000 — AB

## 2023-04-13 LAB — CBC
HCT: 33.4 % — ABNORMAL LOW (ref 39.0–52.0)
Hemoglobin: 10.3 g/dL — ABNORMAL LOW (ref 13.0–17.0)
MCH: 29.6 pg (ref 26.0–34.0)
MCHC: 30.8 g/dL (ref 30.0–36.0)
MCV: 96 fL (ref 80.0–100.0)
Platelets: 219 10*3/uL (ref 150–400)
RBC: 3.48 MIL/uL — ABNORMAL LOW (ref 4.22–5.81)
RDW: 14.2 % (ref 11.5–15.5)
WBC: 11.3 10*3/uL — ABNORMAL HIGH (ref 4.0–10.5)
nRBC: 0 % (ref 0.0–0.2)

## 2023-04-13 MED ORDER — CEFADROXIL 500 MG PO CAPS: 1000.0000 mg | ORAL_CAPSULE | Freq: Two times a day (BID) | ORAL | 0 refills | Status: AC

## 2023-04-13 MED ORDER — CEFADROXIL 500 MG PO CAPS
1000.0000 mg | ORAL_CAPSULE | Freq: Two times a day (BID) | ORAL | Status: DC
  Administered 2023-04-13: 1000 mg via ORAL
  Filled 2023-04-13: qty 2

## 2023-04-13 MED ORDER — CEPHALEXIN 500 MG PO CAPS: 500.0000 mg | ORAL_CAPSULE | Freq: Two times a day (BID) | ORAL | Status: DC

## 2023-04-13 MED ORDER — LOSARTAN POTASSIUM 25 MG PO TABS: 50.0000 mg | ORAL_TABLET | Freq: Every day | ORAL | Status: AC

## 2023-04-13 NOTE — Progress Notes (Signed)
   04/13/23 0846  TOC Brief Assessment  Insurance and Status Reviewed  Patient has primary care physician Yes  Home environment has been reviewed Resides in single family home with spouse  Prior level of function: Independent with ADLs at baseline  Prior/Current Home Services No current home services  Social Drivers of Health Review SDOH reviewed no interventions necessary  Readmission risk has been reviewed Yes  Transition of care needs no transition of care needs at this time

## 2023-04-13 NOTE — Discharge Summary (Signed)
Physician Discharge Summary  [name] ZHY:[id number] DOB: [birth year] DOA: 04/11/2023  PCP: [name], NP  Admit date: 04/11/2023 Discharge date: 04/13/2023  Time spent: 36 minutes  Recommendations for Outpatient Follow-up:  Needs to keep indwelling catheter, complete course of cefadroxil 7-day complete therapy as per orders Needs Chem-12 CBC in 1 week can be done at preop visit Outpatient follow-up with Dr. Urban Gibson CC him with regards to admission  Discharge Diagnoses:  MAIN problem for hospitalization   Sepsis secondary to urinary infection  Please see below for itemized issues addressed in HOpsital- refer to other progress notes for clarity if needed  Discharge Condition: Improved  Diet recommendation: Heart healthy  Filed Weights   04/11/23 1836  Weight: 114.8 kg    History of present illness:  58 year old male known HTN HLD BPH and clot retention status post cystoscopy with fulguration on admission 02/2311/21 2024-he was seen by urology at that time and Foley catheter placed and had CBI Represented to urgent care on 1/22 with fever chills fatigue blood pressures 90s systolic--had Foley catheter changed in urology office 1/21 which may have prompted this His urine grew E. coli BUN/creatinine was slightly elevated additionally with WBC of 20,000 He was transition to [facility] to complete another 6 days of therapy-his blood pressure resolved and was more normal-I explained that his low blood pressure could be due to sepsis We cut back his losartan to 25 mg and he can discharge home as he has not had high-grade fevers   Discharge Exam: Vitals:   04/13/23 0600 04/13/23 0700  BP:    Pulse:    Resp:    Temp: 99.6 F (37.6 C) 98.9 F (37.2 C)  SpO2:      Subj on day of d/c   Improved  General Exam on discharge  Awake obese white male no distress looks comfortable S1-S2 no murmur no rub no gallop CTAB no added sound ROM intact Power 5/5 No CVA  tenderness Neuro intact  Discharge Instructions    Allergies as of 04/13/2023   No Known Allergies      Medication List     STOP taking these medications    ciprofloxacin 500 MG tablet Commonly known as: CIPRO   sulfamethoxazole-trimethoprim 800-160 MG tablet Commonly known as: BACTRIM DS       TAKE these medications    allopurinol 300 MG tablet Commonly known as: ZYLOPRIM Take 300 mg by mouth daily.   cefadroxil 500 MG capsule Commonly known as: DURICEF Take 2 capsules (1,000 mg total) by mouth 2 (two) times daily for 6 days.   finasteride 5 MG tablet Commonly known as: PROSCAR Take 5 mg by mouth daily.   losartan 25 MG tablet Commonly known as: COZAAR Take 2 tablets (50 mg total) by mouth daily. What changed: medication strength   rosuvastatin 10 MG tablet Commonly known as: CRESTOR Take 10 mg by mouth at bedtime.   Tadalafil 2.5 MG Tabs Take 1 tablet by mouth daily.   tamsulosin 0.4 MG Caps capsule Commonly known as: FLOMAX Take 0.4 mg by mouth in the morning and at bedtime.   testosterone cypionate 200 MG/ML injection Commonly known as: DEPOTESTOSTERONE CYPIONATE Inject 200 mg into the muscle 2 (two) times a week.       No Known Allergies    The results of significant diagnostics from this hospitalization (including imaging, microbiology, ancillary and laboratory) are listed below for reference.    Significant Diagnostic Studies: No results found.  Microbiology: Recent Results (from  the past 240 hours)  Culture, blood (routine x 2)     Status: None (Preliminary result)   Collection Time: 04/11/23  6:54 PM   Specimen: BLOOD LEFT HAND  Result Value Ref Range Status   Specimen Description   Final    BLOOD LEFT HAND Performed at [facility], [address], [place], [place] [address]    Special Requests   Final    BOTTLES DRAWN AEROBIC AND ANAEROBIC Blood Culture results may not be optimal due to an inadequate volume of blood  received in culture bottles Performed at [facility], [address] [place], [place] [address]    Culture   Final    NO GROWTH 2 DAYS Performed at [facility], [address], [place], [place] [address]    Report Status PENDING  Incomplete  Urine Culture     Status: Abnormal   Collection Time: 04/11/23  7:16 PM   Specimen: Urine, Random  Result Value Ref Range Status   Specimen Description   Final    URINE, RANDOM Performed at [facility], [address] [place], [place] [address]    Special Requests   Final    NONE Reflexed from [id number] Performed at [facility], [address] [place], [place] [address]    Culture >=100,000 COLONIES/mL ESCHERICHIA COLI (A)  Final   Report Status 04/13/2023 FINAL  Final   Organism ID, Bacteria ESCHERICHIA COLI (A)  Final      Susceptibility   Escherichia coli - MIC*    AMPICILLIN 16 INTERMEDIATE Intermediate     CEFAZOLIN <=4 SENSITIVE Sensitive     CEFEPIME <=0.12 SENSITIVE Sensitive     CEFTRIAXONE <=0.25 SENSITIVE Sensitive     CIPROFLOXACIN >=4 RESISTANT Resistant     GENTAMICIN <=1 SENSITIVE Sensitive     IMIPENEM <=0.25 SENSITIVE Sensitive     NITROFURANTOIN <=16 SENSITIVE Sensitive     TRIMETH/SULFA >=320 RESISTANT Resistant     AMPICILLIN/SULBACTAM 4 SENSITIVE Sensitive     PIP/TAZO <=4 SENSITIVE Sensitive ug/mL    * >=100,000 COLONIES/mL ESCHERICHIA COLI  Culture, blood (routine x 2)     Status: None (Preliminary result)   Collection Time: 04/11/23  8:23 PM   Specimen: BLOOD  Result Value Ref Range Status   Specimen Description   Final    BLOOD SITE NOT SPECIFIED Performed at [facility], [address], [place], [place] [address]    Special Requests   Final    BOTTLES DRAWN AEROBIC AND ANAEROBIC Blood Culture adequate volume Performed at [facility], [address] [place], [place] [address]    Culture   Final     NO GROWTH 2 DAYS Performed at [facility], [address], [place], [place] [address]    Report Status PENDING  Incomplete     Labs: Basic Metabolic Panel: Recent Labs  Lab 04/11/23 1854 04/12/23 0502 04/13/23 0452  NA 134* 136 132*  K 3.9 4.0 3.5  CL 103 103 101  CO2 20* 24 23  GLUCOSE 132* 117* 109*  BUN 21* 20 15  CREATININE 1.25* 1.24 0.84  CALCIUM 9.4 8.5* 8.3*   Liver Function Tests: Recent Labs  Lab 04/11/23 1854  AST 19  ALT 16  ALKPHOS 63  BILITOT 1.1  PROT 8.1  ALBUMIN 3.7   No results for input(s): "LIPASE", "AMYLASE" in the last 168 hours. No results for input(s): "AMMONIA" in the  last 168 hours. CBC: Recent Labs  Lab 04/11/23 1854 04/12/23 0502 04/13/23 0452  WBC 20.3* 14.8* 11.3*  NEUTROABS 16.7*  --   --   HGB 11.7* 10.1* 10.3*  HCT 37.7* 32.4* 33.4*  MCV 94.3 95.6 96.0  PLT 316 221 219   Cardiac Enzymes: No results for input(s): "CKTOTAL", "CKMB", "CKMBINDEX", "TROPONINI" in the last 168 hours. BNP: BNP (last 3 results) No results for input(s): "BNP" in the last 8760 hours.  ProBNP (last 3 results) No results for input(s): "PROBNP" in the last 8760 hours.  CBG: No results for input(s): "GLUCAP" in the last 168 hours.  Signed:  Rhetta Mura MD   [facility] 04/13/2023, 11:09 AM

## 2023-04-16 LAB — CULTURE, BLOOD (ROUTINE X 2)
Culture: NO GROWTH
Culture: NO GROWTH
Special Requests: ADEQUATE

## 2023-04-17 NOTE — Progress Notes (Signed)
For Anesthesia: PCP - [name], NP  Cardiologist - N/A  Bowel Prep reminder:  Chest x-ray -  EKG -  Stress Test -  ECHO -  Cardiac Cath -  Pacemaker/ICD device last checked: Pacemaker orders received: Device Rep notified:  Spinal Cord Stimulator:  Sleep Study - Yes CPAP -   Fasting Blood Sugar - N/A Checks Blood Sugar _____ times a day Date and result of last Hgb A1c-  Last dose of GLP1 agonist- N/A GLP1 instructions:   Last dose of SGLT-2 inhibitors- N/A SGLT-2 instructions:   Blood Thinner Instructions:N/A Aspirin Instructions: Last Dose:  Activity level: Can go up a flight of stairs and activities of daily living without stopping and without chest pain and/or shortness of breath   Able to exercise without chest pain and/or shortness of breath    Anesthesia review: Hx: HTN,OSA  Patient denies shortness of breath, fever, cough and chest pain at PAT appointment   Patient verbalized understanding of instructions that were given to them at the PAT appointment. Patient was also instructed that they will need to review over the PAT instructions again at home before surgery.

## 2023-04-17 NOTE — Patient Instructions (Addendum)
SURGICAL WAITING ROOM VISITATION  Patients having surgery or a procedure may have no more than 2 support people in the waiting area - these visitors may rotate.    Children under the age of 59 must have an adult with them who is not the patient.  Due to an increase in RSV and influenza rates and associated hospitalizations, children ages 46 and under may not visit patients in [facility] hospitals.  Visitors with respiratory illnesses are discouraged from visiting and should remain at home.  If the patient needs to stay at the hospital during part of their recovery, the visitor guidelines for inpatient rooms apply. Pre-op nurse will coordinate an appropriate time for 1 support person to accompany patient in pre-op.  This support person may not rotate.    Please refer to the [facility] website for the visitor guidelines for Inpatients (after your surgery is over and you are in a regular room).       Your procedure is scheduled on:  05/02/2023    Report to [facility]tr[facility]    Report to admitting at   1000AM   Call this number if you have problems the morning of surgery [phone]   Do not eat food or drink liquids  :After Midnight.                           If you have questions, please contact your surgeon's office.       Oral Hygiene is also important to reduce your risk of infection.                                    Remember - BRUSH YOUR TEETH THE MORNING OF SURGERY WITH YOUR REGULAR TOOTHPASTE  DENTURES WILL BE REMOVED PRIOR TO SURGERY PLEASE DO NOT APPLY "Poly grip" OR ADHESIVES!!!   Do NOT smoke after Midnight   Stop all vitamins and herbal supplements 7 days before surgery.   Take these medicines the morning of surgery with A SIP OF WATER: allopurinol, finasteride , tamsulosin   DO NOT TAKE ANY ORAL DIABETIC MEDICATIONS DAY OF YOUR SURGERY  Bring CPAP mask and tubing day of surgery.                              You may not have any metal  on your body including hair pins, jewelry, and body piercing             Do not wear make-up, lotions, powders, perfumes/cologne, or deodorant  Do not wear nail polish including gel and S&S, artificial/acrylic nails, or any other type of covering on natural nails including finger and toenails. If you have artificial nails, gel coating, etc. that needs to be removed by a nail salon please have this removed prior to surgery or surgery may need to be canceled/ delayed if the surgeon/ anesthesia feels like they are unable to be safely monitored.   Do not shave  48 hours prior to surgery.               Men may shave face and neck.   Do not bring valuables to the hospital. [facility] IS NOT             RESPONSIBLE   FOR VALUABLES.   Contacts, glasses, dentures or bridgework may  not be worn into surgery.   Bring small overnight bag day of surgery.   DO NOT BRING YOUR HOME MEDICATIONS TO THE HOSPITAL. PHARMACY WILL DISPENSE MEDICATIONS LISTED ON YOUR MEDICATION LIST TO YOU DURING YOUR ADMISSION IN THE HOSPITAL!    Patients discharged on the day of surgery will not be allowed to drive home.  Someone NEEDS to stay with you for the first 24 hours after anesthesia.   Special Instructions: Bring a copy of your healthcare power of attorney and living will documents the day of surgery if you haven't scanned them before.              Please read over the following fact sheets you were given: IF YOU HAVE QUESTIONS ABOUT YOUR PRE-OP INSTRUCTIONS PLEASE CALL [phone]   If you received a COVID test during your pre-op visit  it is requested that you wear a mask when out in public, stay away from anyone that may not be feeling well and notify your surgeon if you develop symptoms. If you test positive for Covid or have been in contact with anyone that has tested positive in the last 10 days please notify you surgeon.    [facility] - Preparing for Surgery Before surgery, you can play an important role.   Because skin is not sterile, your skin needs to be as free of germs as possible.  You can reduce the number of germs on your skin by washing with CHG (chlorahexidine gluconate) soap before surgery.  CHG is an antiseptic cleaner which kills germs and bonds with the skin to continue killing germs even after washing. Please DO NOT use if you have an allergy to CHG or antibacterial soaps.  If your skin becomes reddened/irritated stop using the CHG and inform your nurse when you arrive at Short Stay. Do not shave (including legs and underarms) for at least 48 hours prior to the first CHG shower.  You may shave your face/neck. Please follow these instructions carefully:  1.  Shower with CHG Soap the night before surgery and the  morning of Surgery.  2.  If you choose to wash your hair, wash your hair first as usual with your  normal  shampoo.  3.  After you shampoo, rinse your hair and body thoroughly to remove the  shampoo.                           4.  Use CHG as you would any other liquid soap.  You can apply chg directly  to the skin and wash                       Gently with a scrungie or clean washcloth.  5.  Apply the CHG Soap to your body ONLY FROM THE NECK DOWN.   Do not use on face/ open                           Wound or open sores. Avoid contact with eyes, ears mouth and genitals (private parts).                       Wash face,  Genitals (private parts) with your normal soap.             6.  Wash thoroughly, paying special attention to the area where your surgery  will be performed.  7.  Thoroughly rinse your body with warm water from the neck down.  8.  DO NOT shower/wash with your normal soap after using and rinsing off  the CHG Soap.                9.  Pat yourself dry with a clean towel.            10.  Wear clean pajamas.            11.  Place clean sheets on your bed the night of your first shower and do not  sleep with pets. Day of Surgery : Do not apply any lotions/deodorants the  morning of surgery.  Please wear clean clothes to the hospital/surgery center.  FAILURE TO FOLLOW THESE INSTRUCTIONS MAY RESULT IN THE CANCELLATION OF YOUR SURGERY PATIENT SIGNATURE_________________________________  NURSE SIGNATURE__________________________________  ________________________________________________________________________

## 2023-04-20 ENCOUNTER — Other Ambulatory Visit: Payer: Self-pay

## 2023-04-20 ENCOUNTER — Encounter (HOSPITAL_COMMUNITY): Payer: Self-pay

## 2023-04-20 ENCOUNTER — Encounter (HOSPITAL_COMMUNITY)
Admission: RE | Admit: 2023-04-20 | Discharge: 2023-04-20 | Disposition: A | Discharge status: Home / Self Care | Payer: Commercial Managed Care - PPO | Source: Ambulatory Visit | Attending: Urology

## 2023-04-20 VITALS — BP 143/83 | HR 76 | Temp 98.0°F | Resp 16 | Ht 70.0 in | Wt 248.0 lb

## 2023-04-20 DIAGNOSIS — Z01818 Encounter for other preprocedural examination: Secondary | ICD-10-CM | POA: Insufficient documentation

## 2023-04-20 DIAGNOSIS — I10 Essential (primary) hypertension: Secondary | ICD-10-CM | POA: Diagnosis not present

## 2023-04-20 DIAGNOSIS — R9431 Abnormal electrocardiogram [ECG] [EKG]: Secondary | ICD-10-CM | POA: Diagnosis not present

## 2023-04-20 HISTORY — DX: Personal history of urinary calculi: Z87.442

## 2023-04-20 HISTORY — DX: Anemia, unspecified: D64.9

## 2023-04-20 LAB — CBC
HCT: 37.2 % — ABNORMAL LOW (ref 39.0–52.0)
Hemoglobin: 11.4 g/dL — ABNORMAL LOW (ref 13.0–17.0)
MCH: 29 pg (ref 26.0–34.0)
MCHC: 30.6 g/dL (ref 30.0–36.0)
MCV: 94.7 fL (ref 80.0–100.0)
Platelets: 444 10*3/uL — ABNORMAL HIGH (ref 150–400)
RBC: 3.93 MIL/uL — ABNORMAL LOW (ref 4.22–5.81)
RDW: 13.8 % (ref 11.5–15.5)
WBC: 9.2 10*3/uL (ref 4.0–10.5)
nRBC: 0 % (ref 0.0–0.2)

## 2023-04-20 LAB — BASIC METABOLIC PANEL
Anion gap: 13 (ref 5–15)
BUN: 16 mg/dL (ref 6–20)
CO2: 22 mmol/L (ref 22–32)
Calcium: 9.9 mg/dL (ref 8.9–10.3)
Chloride: 105 mmol/L (ref 98–111)
Creatinine, Ser: 0.79 mg/dL (ref 0.61–1.24)
GFR, Estimated: >60 mL/min (ref >60)
Glucose, Bld: 90 mg/dL (ref 70–99)
Potassium: 4.2 mmol/L (ref 3.5–5.1)
Sodium: 140 mmol/L (ref 135–145)

## 2023-04-20 LAB — TYPE AND SCREEN
ABO/RH(D): A POS
Antibody Screen: NEGATIVE

## 2023-04-20 NOTE — Progress Notes (Addendum)
Anesthesia Review:  PCP: Janace Hoard  Cardiologist : none  Chest x-ray : EKG : 04/20/23  Echo : Stress test: Cardiac Cath :  Activity level: can do a flight of stairs without difficdutly  Sleep Study/ CPAP : has sleep apnea no cpap  Fasting Blood Sugar :      / Checks Blood Sugar -- times a day:   Blood Thinner/ Instructions /Last Dose: ASA / Instructions/ Last Dose :    03/09/2023- cystoscopy  1/222/25- in ED UTI- discharged on 04/13/23.  Losartan doseage was decreased on 04/13/23 to 25 mg daily.  Changed in meds. .   Pt States at preop he feels better .  PT has noted no blood in ruine since discharge.  Completed post hospital antibiotics on 04/19/23 in am.  PT still has catheter in place with preop appt with [facility] week of 04/23/23.   PT informed preop nurse on 04/20/23 that he is to have bowel prep prior to surgery with clear liquids day before with Mag Citrate and antibiotic of Sulfamethaxazole startin 2 days prior to surgery.  PT was instructed to follow that bowel prep.  PST oviced understanding.    CBC done 1/3`1/25 rotued to DR [facility] on 04/20/23.

## 2023-05-01 NOTE — Anesthesia Preprocedure Evaluation (Signed)
Anesthesia Evaluation  Patient identified by MRN, date of birth, ID band Patient awake    Reviewed: Allergy & Precautions, NPO status , Patient's Chart, lab work & pertinent test results  Airway Mallampati: III  TM Distance: >3 FB Neck ROM: Full    Dental  (+) Teeth Intact, Dental Advisory Given   Pulmonary sleep apnea (no cpap)  Sleep study positive years ago, snores at night   Pulmonary exam normal breath sounds clear to auscultation       Cardiovascular hypertension (171/88 preop, per pt 100-110 SBP. last took losartan 24h ago), Pt. on medications Normal cardiovascular exam Rhythm:Regular Rate:Normal     Neuro/Psych negative neurological ROS  negative psych ROS   GI/Hepatic negative GI ROS, Neg liver ROS,,,  Endo/Other  Obesity BMI 36  Renal/GU negative Renal ROS Bladder dysfunction      Musculoskeletal negative musculoskeletal ROS (+)    Abdominal  (+) + obese  Peds  Hematology  (+) Blood dyscrasia, anemia Hb 11.4, plt 444   Anesthesia Other Findings   Reproductive/Obstetrics negative OB ROS                             Anesthesia Physical Anesthesia Plan  ASA: 2  Anesthesia Plan: General   Post-op Pain Management: Tylenol PO (pre-op)*, Ketamine IV* and Precedex   Induction: Intravenous  PONV Risk Score and Plan: 2 and Ondansetron, Dexamethasone, Midazolam and Treatment may vary due to age or medical condition  Airway Management Planned: Oral ETT  Additional Equipment: None  Intra-op Plan:   Post-operative Plan: Extubation in OR  Informed Consent: I have reviewed the patients History and Physical, chart, labs and discussed the procedure including the risks, benefits and alternatives for the proposed anesthesia with the patient or authorized representative who has indicated his/her understanding and acceptance.     Dental advisory given  Plan Discussed with:  CRNA  Anesthesia Plan Comments:        Anesthesia Quick Evaluation

## 2023-05-02 ENCOUNTER — Other Ambulatory Visit: Payer: Self-pay

## 2023-05-02 ENCOUNTER — Ambulatory Visit (HOSPITAL_BASED_OUTPATIENT_CLINIC_OR_DEPARTMENT_OTHER): Payer: Commercial Managed Care - PPO | Admitting: Certified Registered"

## 2023-05-02 ENCOUNTER — Encounter (HOSPITAL_COMMUNITY): Payer: Self-pay | Admitting: Urology

## 2023-05-02 ENCOUNTER — Ambulatory Visit (HOSPITAL_COMMUNITY): Payer: Commercial Managed Care - PPO | Admitting: Physician Assistant

## 2023-05-02 ENCOUNTER — Encounter (HOSPITAL_COMMUNITY)
Admission: RE | Disposition: A | Discharge status: Home / Self Care | Payer: Self-pay | Source: Home / Self Care | Attending: Urology

## 2023-05-02 ENCOUNTER — Observation Stay (HOSPITAL_COMMUNITY)
Admission: RE | Admit: 2023-05-02 | Discharge: 2023-05-03 | Disposition: A | Discharge status: Home / Self Care | Payer: Commercial Managed Care - PPO | Attending: Urology | Admitting: Urology

## 2023-05-02 DIAGNOSIS — N400 Enlarged prostate without lower urinary tract symptoms: Secondary | ICD-10-CM | POA: Diagnosis not present

## 2023-05-02 DIAGNOSIS — N138 Other obstructive and reflux uropathy: Principal | ICD-10-CM | POA: Diagnosis present

## 2023-05-02 DIAGNOSIS — N411 Chronic prostatitis: Secondary | ICD-10-CM | POA: Insufficient documentation

## 2023-05-02 DIAGNOSIS — R338 Other retention of urine: Secondary | ICD-10-CM

## 2023-05-02 DIAGNOSIS — I10 Essential (primary) hypertension: Secondary | ICD-10-CM | POA: Diagnosis not present

## 2023-05-02 DIAGNOSIS — N410 Acute prostatitis: Secondary | ICD-10-CM | POA: Insufficient documentation

## 2023-05-02 DIAGNOSIS — N401 Enlarged prostate with lower urinary tract symptoms: Secondary | ICD-10-CM

## 2023-05-02 DIAGNOSIS — R339 Retention of urine, unspecified: Secondary | ICD-10-CM | POA: Diagnosis present

## 2023-05-02 HISTORY — PX: XI ROBOTIC ASSISTED SIMPLE PROSTATECTOMY: SHX6713

## 2023-05-02 LAB — HEMOGLOBIN AND HEMATOCRIT, BLOOD
HCT: 36.8 % — ABNORMAL LOW (ref 39.0–52.0)
Hemoglobin: 11.5 g/dL — ABNORMAL LOW (ref 13.0–17.0)

## 2023-05-02 SURGERY — PROSTATECTOMY, SIMPLE, ROBOT-ASSISTED
Anesthesia: General

## 2023-05-02 MED ORDER — SODIUM CHLORIDE (PF) 0.9 % IJ SOLN
INTRAMUSCULAR | Status: AC
  Filled 2023-05-02: qty 20

## 2023-05-02 MED ORDER — DOCUSATE SODIUM 100 MG PO CAPS: 100.0000 mg | ORAL_CAPSULE | Freq: Every day | ORAL | Status: DC | PRN

## 2023-05-02 MED ORDER — HYDROMORPHONE HCL 1 MG/ML IJ SOLN
INTRAMUSCULAR | Status: AC
  Administered 2023-05-02: 0.5 mg via INTRAVENOUS
  Filled 2023-05-02: qty 2

## 2023-05-02 MED ORDER — OXYCODONE HCL 5 MG/5ML PO SOLN: 5.0000 mg | Freq: Once | ORAL | Status: AC | PRN

## 2023-05-02 MED ORDER — PHENYLEPHRINE 80 MCG/ML (10ML) SYRINGE FOR IV PUSH (FOR BLOOD PRESSURE SUPPORT)
PREFILLED_SYRINGE | INTRAVENOUS | Status: DC | PRN
  Administered 2023-05-02: 240 ug via INTRAVENOUS
  Administered 2023-05-02: 80 ug via INTRAVENOUS

## 2023-05-02 MED ORDER — HYDROCODONE-ACETAMINOPHEN 5-325 MG PO TABS: 1.0000 | ORAL_TABLET | Freq: Four times a day (QID) | ORAL | 0 refills | Status: AC | PRN

## 2023-05-02 MED ORDER — DOCUSATE SODIUM 100 MG PO CAPS: 100.0000 mg | ORAL_CAPSULE | Freq: Two times a day (BID) | ORAL | Status: AC

## 2023-05-02 MED ORDER — OXYCODONE HCL 5 MG PO TABS
ORAL_TABLET | ORAL | Status: AC
Start: 2023-05-02 — End: 2023-05-03
  Filled 2023-05-02: qty 1

## 2023-05-02 MED ORDER — BUPIVACAINE LIPOSOME 1.3 % IJ SUSP
INTRAMUSCULAR | Status: AC
  Filled 2023-05-02: qty 20

## 2023-05-02 MED ORDER — CEFAZOLIN SODIUM-DEXTROSE 2-4 GM/100ML-% IV SOLN
2.0000 g | Freq: Four times a day (QID) | INTRAVENOUS | Status: AC
  Administered 2023-05-02 – 2023-05-03 (×2): 2 g via INTRAVENOUS
  Filled 2023-05-02 (×2): qty 100

## 2023-05-02 MED ORDER — AMISULPRIDE (ANTIEMETIC) 5 MG/2ML IV SOLN: 10.0000 mg | Freq: Once | INTRAVENOUS | Status: DC | PRN

## 2023-05-02 MED ORDER — CHLORHEXIDINE GLUCONATE 0.12 % MT SOLN
15.0000 mL | Freq: Once | OROMUCOSAL | Status: AC
  Administered 2023-05-02: 15 mL via OROMUCOSAL

## 2023-05-02 MED ORDER — ACETAMINOPHEN 500 MG PO TABS
1000.0000 mg | ORAL_TABLET | Freq: Four times a day (QID) | ORAL | Status: AC
  Administered 2023-05-02 – 2023-05-03 (×4): 1000 mg via ORAL
  Filled 2023-05-02 (×4): qty 2

## 2023-05-02 MED ORDER — PROPOFOL 10 MG/ML IV BOLUS
INTRAVENOUS | Status: DC | PRN
  Administered 2023-05-02: 200 mg via INTRAVENOUS

## 2023-05-02 MED ORDER — SODIUM CHLORIDE 0.45 % IV SOLN: INTRAVENOUS | Status: AC

## 2023-05-02 MED ORDER — SUGAMMADEX SODIUM 200 MG/2ML IV SOLN
INTRAVENOUS | Status: DC | PRN
  Administered 2023-05-02: 200 mg via INTRAVENOUS

## 2023-05-02 MED ORDER — MIDAZOLAM HCL 2 MG/2ML IJ SOLN
INTRAMUSCULAR | Status: AC
  Filled 2023-05-02: qty 2

## 2023-05-02 MED ORDER — FENTANYL CITRATE (PF) 100 MCG/2ML IJ SOLN
INTRAMUSCULAR | Status: AC
Start: 2023-05-02 — End: ?
  Filled 2023-05-02: qty 2

## 2023-05-02 MED ORDER — FINASTERIDE 5 MG PO TABS
5.0000 mg | ORAL_TABLET | Freq: Every day | ORAL | Status: DC
  Administered 2023-05-02 – 2023-05-03 (×2): 5 mg via ORAL
  Filled 2023-05-02 (×2): qty 1

## 2023-05-02 MED ORDER — LIDOCAINE 2% (20 MG/ML) 5 ML SYRINGE
INTRAMUSCULAR | Status: DC | PRN
  Administered 2023-05-02: 60 mg via INTRAVENOUS

## 2023-05-02 MED ORDER — AMOXICILLIN-POT CLAVULANATE 875-125 MG PO TABS
1.0000 | ORAL_TABLET | Freq: Two times a day (BID) | ORAL | 0 refills | Status: AC
Start: 2023-05-02 — End: 2023-05-05

## 2023-05-02 MED ORDER — ALUM & MAG HYDROXIDE-SIMETH 200-200-20 MG/5ML PO SUSP
30.0000 mL | Freq: Once | ORAL | Status: AC
  Administered 2023-05-02: 30 mL via ORAL
  Filled 2023-05-02: qty 30

## 2023-05-02 MED ORDER — DIPHENHYDRAMINE HCL 50 MG/ML IJ SOLN: 12.5000 mg | Freq: Four times a day (QID) | INTRAMUSCULAR | Status: DC | PRN

## 2023-05-02 MED ORDER — DOCUSATE SODIUM 100 MG PO CAPS
100.0000 mg | ORAL_CAPSULE | Freq: Two times a day (BID) | ORAL | Status: DC
  Administered 2023-05-02 – 2023-05-03 (×2): 100 mg via ORAL
  Filled 2023-05-02 (×2): qty 1

## 2023-05-02 MED ORDER — SODIUM CHLORIDE 0.9 % IV BOLUS
1000.0000 mL | Freq: Once | INTRAVENOUS | Status: AC
  Administered 2023-05-02: 1000 mL via INTRAVENOUS

## 2023-05-02 MED ORDER — ALLOPURINOL 300 MG PO TABS
300.0000 mg | ORAL_TABLET | Freq: Every day | ORAL | Status: DC
  Administered 2023-05-03: 300 mg via ORAL
  Filled 2023-05-02: qty 1

## 2023-05-02 MED ORDER — ONDANSETRON HCL 4 MG/2ML IJ SOLN: 4.0000 mg | Freq: Once | INTRAMUSCULAR | Status: DC | PRN

## 2023-05-02 MED ORDER — LACTATED RINGERS IV SOLN: INTRAVENOUS | Status: DC

## 2023-05-02 MED ORDER — ACETAMINOPHEN 500 MG PO TABS
1000.0000 mg | ORAL_TABLET | Freq: Once | ORAL | Status: AC
  Administered 2023-05-02: 1000 mg via ORAL
  Filled 2023-05-02: qty 2

## 2023-05-02 MED ORDER — MEPERIDINE HCL 50 MG/ML IJ SOLN: 6.2500 mg | INTRAMUSCULAR | Status: DC | PRN

## 2023-05-02 MED ORDER — DIPHENHYDRAMINE HCL 12.5 MG/5ML PO ELIX: 12.5000 mg | ORAL_SOLUTION | Freq: Four times a day (QID) | ORAL | Status: DC | PRN

## 2023-05-02 MED ORDER — ROCURONIUM BROMIDE 10 MG/ML (PF) SYRINGE
PREFILLED_SYRINGE | INTRAVENOUS | Status: DC | PRN
  Administered 2023-05-02: 20 mg via INTRAVENOUS
  Administered 2023-05-02: 100 mg via INTRAVENOUS

## 2023-05-02 MED ORDER — ONDANSETRON HCL 4 MG/2ML IJ SOLN
INTRAMUSCULAR | Status: DC | PRN
  Administered 2023-05-02: 4 mg via INTRAVENOUS

## 2023-05-02 MED ORDER — FENTANYL CITRATE (PF) 100 MCG/2ML IJ SOLN
INTRAMUSCULAR | Status: AC
  Filled 2023-05-02: qty 2

## 2023-05-02 MED ORDER — LOSARTAN POTASSIUM 25 MG PO TABS
25.0000 mg | ORAL_TABLET | Freq: Every day | ORAL | Status: DC
Start: 2023-05-02 — End: 2023-05-03
  Administered 2023-05-02 – 2023-05-03 (×2): 25 mg via ORAL
  Filled 2023-05-02 (×2): qty 1

## 2023-05-02 MED ORDER — ORAL CARE MOUTH RINSE: 15.0000 mL | Freq: Once | OROMUCOSAL | Status: AC

## 2023-05-02 MED ORDER — CEFAZOLIN SODIUM-DEXTROSE 2-4 GM/100ML-% IV SOLN: 2.0000 g | INTRAVENOUS | Status: DC

## 2023-05-02 MED ORDER — SODIUM CHLORIDE (PF) 0.9 % IJ SOLN
INTRAMUSCULAR | Status: DC | PRN
  Administered 2023-05-02: 20 mL

## 2023-05-02 MED ORDER — OXYCODONE HCL 5 MG PO TABS
5.0000 mg | ORAL_TABLET | Freq: Once | ORAL | Status: AC | PRN
  Administered 2023-05-02: 5 mg via ORAL

## 2023-05-02 MED ORDER — KETAMINE HCL 50 MG/5ML IJ SOSY
PREFILLED_SYRINGE | INTRAMUSCULAR | Status: AC
  Filled 2023-05-02: qty 5

## 2023-05-02 MED ORDER — DEXAMETHASONE SODIUM PHOSPHATE 10 MG/ML IJ SOLN
INTRAMUSCULAR | Status: DC | PRN
  Administered 2023-05-02: 4 mg via INTRAVENOUS

## 2023-05-02 MED ORDER — KETOROLAC TROMETHAMINE 30 MG/ML IJ SOLN: 30.0000 mg | Freq: Once | INTRAMUSCULAR | Status: DC | PRN

## 2023-05-02 MED ORDER — TRIPLE ANTIBIOTIC 3.5-400-5000 EX OINT: 1.0000 | TOPICAL_OINTMENT | Freq: Three times a day (TID) | CUTANEOUS | Status: DC | PRN

## 2023-05-02 MED ORDER — HYDROMORPHONE HCL 1 MG/ML IJ SOLN
0.2500 mg | INTRAMUSCULAR | Status: DC | PRN
  Administered 2023-05-02 (×2): 0.5 mg via INTRAVENOUS

## 2023-05-02 MED ORDER — MIDAZOLAM HCL 2 MG/2ML IJ SOLN
INTRAMUSCULAR | Status: DC | PRN
  Administered 2023-05-02: 2 mg via INTRAVENOUS

## 2023-05-02 MED ORDER — ROSUVASTATIN CALCIUM 10 MG PO TABS
10.0000 mg | ORAL_TABLET | Freq: Every day | ORAL | Status: DC
  Administered 2023-05-02: 10 mg via ORAL
  Filled 2023-05-02: qty 1

## 2023-05-02 MED ORDER — BUPIVACAINE LIPOSOME 1.3 % IJ SUSP
INTRAMUSCULAR | Status: DC | PRN
  Administered 2023-05-02: 40 mL

## 2023-05-02 MED ORDER — ONDANSETRON HCL 4 MG/2ML IJ SOLN: 4.0000 mg | INTRAMUSCULAR | Status: DC | PRN

## 2023-05-02 MED ORDER — CEFAZOLIN SODIUM-DEXTROSE 2-4 GM/100ML-% IV SOLN
2.0000 g | INTRAVENOUS | Status: AC
  Administered 2023-05-02: 2 g via INTRAVENOUS
  Filled 2023-05-02: qty 100

## 2023-05-02 MED ORDER — FENTANYL CITRATE (PF) 100 MCG/2ML IJ SOLN
INTRAMUSCULAR | Status: DC | PRN
  Administered 2023-05-02 (×4): 50 ug via INTRAVENOUS

## 2023-05-02 MED ORDER — PROPOFOL 10 MG/ML IV BOLUS
INTRAVENOUS | Status: AC
  Filled 2023-05-02: qty 20

## 2023-05-02 MED ORDER — OXYCODONE HCL 5 MG PO TABS
5.0000 mg | ORAL_TABLET | ORAL | Status: DC | PRN
  Administered 2023-05-02 – 2023-05-03 (×3): 5 mg via ORAL
  Filled 2023-05-02 (×3): qty 1

## 2023-05-02 MED ORDER — HYOSCYAMINE SULFATE 0.125 MG SL SUBL: 0.1250 mg | SUBLINGUAL_TABLET | SUBLINGUAL | Status: DC | PRN

## 2023-05-02 MED ORDER — KETAMINE HCL 10 MG/ML IJ SOLN
INTRAMUSCULAR | Status: DC | PRN
  Administered 2023-05-02: 30 mg via INTRAVENOUS
  Administered 2023-05-02: 20 mg via INTRAVENOUS

## 2023-05-02 SURGICAL SUPPLY — 68 items
APPLICATOR COTTON TIP 6 STRL (MISCELLANEOUS) ×1 IMPLANT
APPLICATOR COTTON TIP 6IN STRL (MISCELLANEOUS) ×1 IMPLANT
BAG COUNTER SPONGE SURGICOUNT (BAG) IMPLANT
CATH FOLEY 2WAY SLVR 18FR 30CC (CATHETERS) ×1 IMPLANT
CATH TIEMANN FOLEY 18FR 5CC (CATHETERS) IMPLANT
CATH URTH STD 24FR FL 3W 2 (CATHETERS) ×1 IMPLANT
CHLORAPREP W/TINT 26 (MISCELLANEOUS) ×1 IMPLANT
CLIP LIGATING HEM O LOK PURPLE (MISCELLANEOUS) IMPLANT
CLOTH BEACON ORANGE TIMEOUT ST (SAFETY) ×1 IMPLANT
COVER SURGICAL LIGHT HANDLE (MISCELLANEOUS) ×1 IMPLANT
COVER TIP SHEARS 8 DVNC (MISCELLANEOUS) ×1 IMPLANT
CUTTER ECHEON FLEX ENDO 45 340 (ENDOMECHANICALS) IMPLANT
DERMABOND ADVANCED .7 DNX12 (GAUZE/BANDAGES/DRESSINGS) ×1 IMPLANT
DRAIN CHANNEL RND F F (WOUND CARE) IMPLANT
DRAPE ARM DVNC X/XI (DISPOSABLE) ×4 IMPLANT
DRAPE COLUMN DVNC XI (DISPOSABLE) ×1 IMPLANT
DRAPE SURG IRRIG POUCH 19X23 (DRAPES) ×1 IMPLANT
DRIVER NDL LRG 8 DVNC XI (INSTRUMENTS) ×2 IMPLANT
DRIVER NDLE LRG 8 DVNC XI (INSTRUMENTS) ×2 IMPLANT
DRSG TEGADERM 4X4.75 (GAUZE/BANDAGES/DRESSINGS) ×1 IMPLANT
ELECT PENCIL ROCKER SW 15FT (MISCELLANEOUS) ×1 IMPLANT
ELECT REM PT RETURN 15FT ADLT (MISCELLANEOUS) ×1 IMPLANT
FORCEPS BPLR LNG DVNC XI (INSTRUMENTS) ×1 IMPLANT
FORCEPS PROGRASP DVNC XI (FORCEP) ×1 IMPLANT
GAUZE SPONGE 4X4 12PLY STRL (GAUZE/BANDAGES/DRESSINGS) ×1 IMPLANT
GLOVE BIO SURGEON STRL SZ 6.5 (GLOVE) ×1 IMPLANT
GLOVE BIOGEL PI IND STRL 7.5 (GLOVE) IMPLANT
GLOVE SURG LX STRL 7.5 STRW (GLOVE) ×2 IMPLANT
GOWN SRG XL LVL 4 BRTHBL STRL (GOWNS) ×1 IMPLANT
GOWN STRL REUS W/ TWL XL LVL3 (GOWN DISPOSABLE) ×2 IMPLANT
HOLDER FOLEY CATH W/STRAP (MISCELLANEOUS) ×1 IMPLANT
IRRIG SUCT STRYKERFLOW 2 WTIP (MISCELLANEOUS) ×1 IMPLANT
IRRIGATION SUCT STRKRFLW 2 WTP (MISCELLANEOUS) ×1 IMPLANT
IV LACTATED RINGERS 1000ML (IV SOLUTION) ×1 IMPLANT
KIT TURNOVER KIT A (KITS) IMPLANT
NDL INSUFFLATION 14GA 120MM (NEEDLE) ×1 IMPLANT
NEEDLE INSUFFLATION 14GA 120MM (NEEDLE) ×1 IMPLANT
PACK ROBOT UROLOGY CUSTOM (CUSTOM PROCEDURE TRAY) ×1 IMPLANT
PAD POSITIONING PINK XL (MISCELLANEOUS) ×1 IMPLANT
PORT ACCESS TROCAR AIRSEAL 12 (TROCAR) ×1 IMPLANT
RELOAD STAPLE 45 4.1 GRN THCK (STAPLE) IMPLANT
SCISSORS LAP 5X45 EPIX DISP (ENDOMECHANICALS) IMPLANT
SCISSORS MNPLR CVD DVNC XI (INSTRUMENTS) ×1 IMPLANT
SEAL UNIV 5-12 XI (MISCELLANEOUS) ×4 IMPLANT
SET CYSTO W/LG BORE CLAMP LF (SET/KITS/TRAYS/PACK) IMPLANT
SET TRI-LUMEN FLTR TB AIRSEAL (TUBING) ×1 IMPLANT
SOL ELECTROSURG ANTI STICK (MISCELLANEOUS) ×1 IMPLANT
SOLUTION ELECTROSURG ANTI STCK (MISCELLANEOUS) ×1 IMPLANT
SPIKE FLUID TRANSFER (MISCELLANEOUS) ×1 IMPLANT
SPONGE T-LAP 4X18 ~~LOC~~+RFID (SPONGE) IMPLANT
STAPLE RELOAD 45 GRN (STAPLE) ×1 IMPLANT
SUT ETHILON 3 0 PS 1 (SUTURE) ×1 IMPLANT
SUT MNCRL AB 4-0 PS2 18 (SUTURE) ×2 IMPLANT
SUT PDS AB 1 CT1 27 (SUTURE) ×2 IMPLANT
SUT STRATA PDS 2-0 23 CT-1 (SUTURE) ×2 IMPLANT
SUT STRATAFIX SPIRAL 3-0 PDS+ (SUTURE) ×2 IMPLANT
SUT VIC AB 0 CT1 27XBRD ANTBC (SUTURE) ×3 IMPLANT
SUT VIC AB 2-0 SH 27X BRD (SUTURE) ×1 IMPLANT
SUT VICRYL 0 UR6 27IN ABS (SUTURE) ×1 IMPLANT
SUT VLOC 180 2-0 6IN GS21 (SUTURE) IMPLANT
SUT VLOC BARB 180 ABS3/0GR12 (SUTURE) ×2 IMPLANT
SUTURE STRATFX SPIRAL 3-0 PDS+ (SUTURE) ×2 IMPLANT
SUTURE VLOC BRB 180 ABS3/0GR12 (SUTURE) IMPLANT
SYS BAG RETRIEVAL 10MM (BASKET) ×1 IMPLANT
SYSTEM BAG RETRIEVAL 10MM (BASKET) ×1 IMPLANT
TROCAR Z-THREAD FIOS 5X100MM (TROCAR) IMPLANT
TROCAR Z-THREAD OPTICAL 5X100M (TROCAR) IMPLANT
WATER STERILE IRR 1000ML POUR (IV SOLUTION) ×1 IMPLANT

## 2023-05-02 NOTE — Transfer of Care (Signed)
Immediate Anesthesia Transfer of Care Note  Patient: [name]  Procedure(s) Performed: XI ROBOTIC ASSISTED SIMPLE PROSTATECTOMY  Patient Location: PACU  Anesthesia Type:General  Level of Consciousness: awake, alert , and oriented  Airway & Oxygen Therapy: Patient Spontanous Breathing  Post-op Assessment: Report given to RN  Post vital signs: stable  Last Vitals:  Vitals Value Taken Time  BP 119/63 05/02/23 1555  Temp 36.9 C 05/02/23 1555  Pulse 61 05/02/23 1558  Resp 16 05/02/23 1558  SpO2 98 % 05/02/23 1558  Vitals shown include unfiled device data.  Last Pain:  Vitals:   05/02/23 1030  TempSrc:   PainSc: 0-No pain         Complications: No notable events documented.

## 2023-05-02 NOTE — Brief Op Note (Signed)
05/02/2023  3:40 PM  PATIENT:  [name]  58 y.o. male  PRE-OPERATIVE DIAGNOSIS: LARGE PROSTATE, URINARY RETENTION  POST-OPERATIVE DIAGNOSIS:  LARGE PROSTATE, URINARY RETENTION  PROCEDURE:  Procedure(s) with comments: XI ROBOTIC ASSISTED SIMPLE PROSTATECTOMY (N/A) - 180 MINUTES NEEDED FOR CASE  SURGEON:  Surgeons and Role:    * Manny, Delbert Phenix., MD - Primary  PHYSICIAN ASSISTANT:   ASSISTANTS: Harrie Foreman PA   ANESTHESIA:   local and general  EBL:    BLOOD ADMINISTERED:none  DRAINS:  JP to bulb; Foley to gravity (irrigation port plugged)    LOCAL MEDICATIONS USED:  MARCAINE     SPECIMEN:  Source of Specimen:  prostate adenoma  DISPOSITION OF SPECIMEN:  PATHOLOGY  COUNTS:  YES  TOURNIQUET:  * No tourniquets in log *  DICTATION: .Other Dictation: Dictation Number [id number]  PLAN OF CARE: Admit for overnight observation  PATIENT DISPOSITION:  PACU - hemodynamically stable.   Delay start of Pharmacological VTE agent (>24hrs) due to surgical blood loss or risk of bleeding: yes

## 2023-05-02 NOTE — Discharge Instructions (Signed)
1- Drain Sites - You may have some mild persistent drainage from old drain site for several days, this is normal. This can be covered with cotton gauze for convenience.  2 - Stiches - Your stitches are all dissolvable. You may notice a "loose thread" at your incisions, these are normal and require no intervention. You may cut them flush to the skin with fingernail clippers if needed for comfort.  3 - Diet - No restrictions  4 - Activity - No heavy lifting / straining (any activities that require valsalva or "bearing down") x 4 weeks. Otherwise, no restrictions.  5 - Bathing - You may shower immediately. Do not take a bath or get into swimming pool where incision sites are submersed in water x 4 weeks.   6 - Catheter - Will remain in place until removed at your next appointment. It may be cleaned with soap and water in the shower. It may be disconnected from the drain bag while in the shower to avoid tripping over the tube. You may apply Neosporin or Vaseline ointment as needed to the tip of the penis where the catheter inserts to reduce friction and irritation in this spot.   7 - When to Call the Doctor - Call MD for any fever >102, any acute wound problems, or any severe nausea / vomiting. You can call the [facility] [phone]) 24 hours a day 365 days a year. It will roll-over to the answering service and on-call physician after hours.

## 2023-05-02 NOTE — Op Note (Unsigned)
[name], [name] MEDICAL RECORD NO: [id number] ACCOUNT NO: [id number] DATE OF BIRTH: 1966-01-07 FACILITY: [facility] LOCATION: [facility] PHYSICIAN: Timothy Ache, MD  Operative Report   DATE OF PROCEDURE: 05/02/2023  PREOPERATIVE DIAGNOSIS: Large prostate with recurrent urinary retention.  PROCEDURE PERFORMED: Robotic assisted laparoscopic simple prostatectomy.  ESTIMATED BLOOD LOSS:  100 mL.  COMPLICATIONS:  None.  SPECIMENS: Prostatic adenocarcinoma for permanent pathology.  FINDINGS: 1.  Bilobar prostatic hypertrophy. 2.  Two very small bladder stones.  These were removed. 3.  Wide open urinary channel from the bladder neck to the membranous urethra, following simple prostatectomy.  ASSISTANT:  Timothy Foreman, PA  DRAINS: 1.  Jackson-Pratt drain to bulb suction. 2.  3 way foley to gravity, irrigation port plugged.   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  He developed frank urinary retention several months ago.  He was placed on appropriate medical therapy with alpha blockers and 5-alpha-reductase  inhibitors; however, retention remained refractory.  He has had multiple trials of void. With the catheter in place, he also has significant problems with recurrent infections and even had a hospitalization for severe urosepsis, which fortunately  responded well to aggressive medical therapy.  He was referred for consideration of further management options such as outlet procedure, the goal of catheter free.  Given his ultrasound appearance of prostate on imaging of approximately 180 g, I  counseled him towards simple prostatectomy being most definitive and he wished to proceed.  He presents for this today. Informed consent was obtained and placed in the medical records.  He has been on culture-specific antibiotics preoperatively to reduce  known colonization.  DESCRIPTION OF PROCEDURE:  The patient being  [name] verified and the procedure being simple prostatectomy with node was confirmed.  Procedure timeout was performed.  Intravenous antibiotics administered.  General endotracheal anesthesia induced.   The patient was placed into a low lithotomy position.  Sterile field was created, prepped and draped the patient's penis, perineum, and proximal thighs using iodine and his infraxiphoid using chlorhexidine gluconate after clipping and shaving.  He was  further fastened to operating table using 3-inch tape over foam padding across supraxiphoid chest.  Arms were tucked to the side with gel rolls.  A test of steep Trendelenburg positioning was performed and found to be suitably positioned.  His in situ  Foley catheter had already been removed prior to prepping. A new 16-French Foley catheter was placed for straight drain.  Next, a high flow, low pressure, pneumoperitoneum was obtained using Veress technique in the supraumbilical midline away from his  area of prior umbilical hernia repair.  We had already reviewed his imaging and known to have a preserved fat plane above his intestine in this area.  The robotic camera port was then placed in the location.  Laparoscopic examination of peritoneal cavity  did reveal some significant adhesions with the omentum to the anterior abdominal wall, and in the areas of prior hernia repair, fortunately no bowel in the area.  Additional ports were then placed as follows:  Right paramedian 8 mm robotic port, right far  lateral 12 mm AirSeal assist port, left paramedian 8 mm robotic port, left far lateral 8 mm robotic port, right paramedian 5 mm suction port.  Next, using a right-sided visualization approach and in a laparoscopic technique, very careful adhesiolysis was  performed of the omental adhesions in the anterior midline.  These were very carefully released, allowing much better visualization  of the true abdomen.  Robot was then docked and passed through the  electronic checks.  Attention was then direct at development of the space of retzius. Incision was made lateral to the right ligament  from the midline towards the internal ring coursing along the iliac vessels towards the right ureter. The right vas deferens was encountered and ligating using a superior bucket handle and the right bladder was swept away form the sidewall towards the area  of the endofascia on the right side.  A mirror image dissection was performed on the left side.  Anterior dissection was obtained with cautery scissors.  There was some desmoplasia in the area of the Space of Rectus consistent with his known prior  recurrent cystitis; however, the planes were still quite safe.  The bladder neck area was identified and moving the Foley catheter back and forth and the endopelvic fascia was then carefully swept away from the dorsal venous complex, just enough to allow  green load stapler placement and the dorsal vein was controlled with green load stapler which resulted in excellent hemostasis.  Attention was directed at the bladder neck and the bladder neck prostate junction area was defatted and an inverted U cystotomy was made approximately 2 cm  proximal to the area of the true bladder neck.  This performed approximately 50% circumference of the bladder.  This allowed excellent visualization of the very large bilobar prostatic hypertrophy.  There was minimal median lobe.  There were two small  bladder stones noted, each approximately pencil eraser size that were removed and set aside to be given to the patient.  The trigonal ridge and ureteral rofices were clearly visualized.  Next, three stay sutures were applied in the adenoma in the right lobe, left lobe,  posterior medial area retraction.  An incision was made in the posterior bladder neck mucosa approximately 2 cm distal to the trigonal ridge, posterior to the posterior adenoma and the prostate adenoma plane was entered posteriorly in  the 6 o'clock  position.  This was carried distally to the area of the prostatic apex.  It was verified by anterior curvature.  This plane did have moderate desmoplasia as anticipated given his prior infections; however, no gross purulence or abscesses were noted. This  was then swept laterally to the 3 o'clock and 9 o'clock positions respectively.  Right lateral dissection was performed placing medial traction on the right lateral stay suture and connecting the abnormal plane from the 3 o'clock to 12 o'clock position  and then a contralateral technique was used on the left side from the 9 o-clock 12 o'clock position. Since the adenoma was only remained attached by its anterior attachments, this was visualized by making a butterfly incision at the 12 o'clock position of the  adenoma, all the way to the apex and then releasing the apical tissue with cautery scissors.  This completely freed up the large adenoma and specimen was placed in an Endocatch bag for later retrieval.  The prostatic fossa was carefully inspected.   Additional hemostasis was achieved using point coagulation cautery, which revealed an excellent hemostasis of the prostatic fossa.  Attention was then directed at mucosal advancement.  And double armed 3-0 Vicryl suture was used to reapproximate the  posterior bladder neck mucosa to the area of the membranous urethra for its posterior 50% circumference performing essentially a 50% urethral anastomosis to this tissue.  This resulted in excellent hemostasis of the posterior plane and excellent mucosal  bridging.  Again,  the trigone of the orifices were visualized and bilateral orifices remained patent with copious urine.  The prior cystotomy was then closed using two separate suture lines of running 2-0 V-Loc meeting in the middle and a third suture at  the anterior position.  I was quite happy with the reapproximation.  A new #20-French 3-way catheter was then placed, 20 mL were on the  balloon.  Irrigation port was plugged. This was flushed quantitatively.  A closed suction drain was brought out through the previous left lateral most robotic port site at the area of the  peritoneal cavity. The robot was then undocked. The previous right lateral assistant portal site was closed at the level of fascia using Carter-Thomason suture passer and 0 Vicryl.  Specimen was retrieved by extending the previous camera port site  superiorly for a total distance of approximately 3 cm, removing the adenoma specimen and setting it aside for permanent pathology.  Extraction site was closed at the level of fascia using in figure-of-eight PDS x 4 followed by reapproximation of Scarpa's  with running Vicryl.  All incision sites were infiltrated with dilute lipolyzed Marcaine and closed at the level of the skin using subcuticular Monocryl followed by Dermabond.  Procedure was then terminated.  The patient tolerated procedure well, no  immediate perioperative complications.  The patient was taken to postanesthesia care unit in stable condition.  Plan for observation admission.  Please note, first assistant, [name], was crucial for all portions of the surgery today.  She provided invaluable retraction, suctioning, vascular stapling, robotic instrument exchange and general first assistance.   MUK D: 05/02/2023 3:51:09 pm T: 05/02/2023 11:10:00 pm  JOB: [id number]/ [id number]

## 2023-05-02 NOTE — H&P (Signed)
[name] is an 58 y.o. male.    Chief Complaint: Pre-OP Simple Postatectomy  HPI:   1 - Elevated PSA - low risk MRI and negative BX 05/2022 for PSA 8.9   2 - Urinary Retention / Massive Prostate - new urinary retention and hematuria with clots late 2024 from massive histoologic BPH. TRUS 180gm no medain, bot some intravesical protrusion of lobes.    PMH sig for mild obesity, umbilcal hernia repair with mesh. His wife [name] is involved. [place] form home with international team in [place]. His PCP is [name] PA    Today " [name] " is seen to proceed with simple prostatectomy. Had been on CX-specific augmentin for e. Coli bacteruria. NO interval fevers. Hbg 11, Cr 0.8 most recentliy.    Past Medical History:  Diagnosis Date   Anemia    History of kidney stones    Hypertension    Sleep apnea    does not use cpap currently    Past Surgical History:  Procedure Laterality Date   C-CATH HEMA 3 WAY 30CC COUDE-2  03/09/2023   CYSTOSCOPY WITH FULGERATION N/A 03/09/2023   Procedure: CYSTOSCOPY WITH FULGERATION, CLOT EVACUATION;  Surgeon: Timothy Paci, MD;  Location: [facility];  Service: Urology;  Laterality: N/A;   HAND IRRIGATION W/ NORMAL SALINE  03/09/2023   HERNIA REPAIR     PROSTATE BIOPSY     SHOULDER ARTHROSCOPY      No family history on file. Social History:  reports that he has never smoked. He has never used smokeless tobacco. He reports current alcohol use. He reports that he does not use drugs.  Allergies: No Known Allergies  No medications prior to admission.    No results found for this or any previous visit (from the past 48 hours). No results found.  Review of Systems  Constitutional:  Negative for chills and fever.  Genitourinary:  Positive for difficulty urinating.  All other systems reviewed and are negative.   There were no vitals taken for this visit. Physical Exam Vitals reviewed.  HENT:     Head:  Normocephalic.  Eyes:     Pupils: Pupils are equal, round, and reactive to light.  Cardiovascular:     Rate and Rhythm: Normal rate.  Pulmonary:     Effort: Pulmonary effort is normal.  Abdominal:     General: Abdomen is flat.  Genitourinary:    Comments: Catheter in place with non-foul urine.  Musculoskeletal:        General: Normal range of motion.     Cervical back: Normal range of motion.  Skin:    General: Skin is warm.  Neurological:     General: No focal deficit present.     Mental Status: He is alert.  Psychiatric:        Mood and Affect: Mood normal.      Assessment/Plan  Proceed as planned with simple prostatectomy. Risks, benefits, alternatives, expected peri-op course discussed previously and reiterated today.   [name]., MD 05/02/2023, 6:49 AM

## 2023-05-02 NOTE — Anesthesia Procedure Notes (Signed)
Procedure Name: Intubation Date/Time: 05/02/2023 1:00 PM  Performed by: Sindy Guadeloupe, CRNAPre-anesthesia Checklist: Patient identified, Emergency Drugs available, Suction available and Patient being monitored Patient Re-evaluated:Patient Re-evaluated prior to induction Oxygen Delivery Method: Circle system utilized Preoxygenation: Pre-oxygenation with 100% oxygen Induction Type: IV induction Ventilation: Oral airway inserted - appropriate to patient size and Two handed mask ventilation required Laryngoscope Size: Mac and 4 Grade View: Grade I Tube type: Oral Tube size: 7.5 mm Number of attempts: 1 Airway Equipment and Method: Stylet and Oral airway Placement Confirmation: ETT inserted through vocal cords under direct vision, positive ETCO2 and breath sounds checked- equal and bilateral Secured at: 24 cm Tube secured with: Tape Dental Injury: Teeth and Oropharynx as per pre-operative assessment

## 2023-05-03 ENCOUNTER — Encounter (HOSPITAL_COMMUNITY): Payer: Self-pay | Admitting: Urology

## 2023-05-03 DIAGNOSIS — N400 Enlarged prostate without lower urinary tract symptoms: Secondary | ICD-10-CM | POA: Diagnosis not present

## 2023-05-03 LAB — BASIC METABOLIC PANEL
Anion gap: 11 (ref 5–15)
BUN: 12 mg/dL (ref 6–20)
CO2: 20 mmol/L — ABNORMAL LOW (ref 22–32)
Calcium: 8.4 mg/dL — ABNORMAL LOW (ref 8.9–10.3)
Chloride: 101 mmol/L (ref 98–111)
Creatinine, Ser: 0.72 mg/dL (ref 0.61–1.24)
GFR, Estimated: >60 mL/min (ref >60)
Glucose, Bld: 116 mg/dL — ABNORMAL HIGH (ref 70–99)
Potassium: 4.1 mmol/L (ref 3.5–5.1)
Sodium: 132 mmol/L — ABNORMAL LOW (ref 135–145)

## 2023-05-03 LAB — HEMOGLOBIN AND HEMATOCRIT, BLOOD
HCT: 35.3 % — ABNORMAL LOW (ref 39.0–52.0)
Hemoglobin: 10.8 g/dL — ABNORMAL LOW (ref 13.0–17.0)

## 2023-05-03 MED ORDER — ALUM & MAG HYDROXIDE-SIMETH 200-200-20 MG/5ML PO SUSP
30.0000 mL | Freq: Once | ORAL | Status: AC | PRN
  Administered 2023-05-03: 30 mL via ORAL
  Filled 2023-05-03: qty 30

## 2023-05-03 NOTE — Discharge Summary (Signed)
Date of admission: 05/02/2023  Date of discharge: 05/03/2023  Admission diagnosis:  BPH with obstruction/lower urinary tract symptoms [N40.1, N13.8]   Discharge diagnosis:  BPH with obstruction/lower urinary tract symptoms [N40.1, N13.8]  Secondary diagnoses:   Active Ambulatory Problems    Diagnosis Date Noted   Gross hematuria 03/09/2023   Catheter-associated urinary tract infection (HCC) 04/11/2023   Sleep apnea    Hypertension    Hyperlipidemia 04/11/2023   Resolved Ambulatory Problems    Diagnosis Date Noted   No Resolved Ambulatory Problems   Past Medical History:  Diagnosis Date   Anemia    History of kidney stones      History and Physical: For full details, please see admission history and physical. Briefly, [name] is a 58 y.o. year old patient who was admitted with BPH.   Hospital Course: Pt admitted and underwent simple prostatectomy on 05/03/23. Their hospital course was unremarkable. By POD1, they were tolerating a regular diet, pain was controlled with oral medications, and they were deemed appropriate for discharge.   Their course was complicated by: None   On the day of discharge, the patient was tolerating a regular diet and their pain was well controlled. They were determined to be stable for discharge home. They will discharge home with foley catheter. Drain removed prior to discharge.   Physical Exam:  Neuro: AAOx4 Resp: Non-labored breathing on RA Abd: Appropriately tender, soft, non-distended. Incisions c/d/I GU: foley in place draining yellow urine with tinge of pink.   Laboratory values:  Recent Labs    05/02/23 1610 05/03/23 0520  HGB 11.5* 10.8*  HCT 36.8* 35.3*   Recent Labs    05/03/23 0520  CREATININE 0.72    Disposition: Home  Discharge medications:  Allergies as of 05/03/2023   No Known Allergies      Medication List     STOP taking these medications    tamsulosin 0.4 MG Caps capsule Commonly known as: FLOMAX    testosterone cypionate 200 MG/ML injection Commonly known as: DEPOTESTOSTERONE CYPIONATE       TAKE these medications    allopurinol 300 MG tablet Commonly known as: ZYLOPRIM Take 300 mg by mouth daily.   amoxicillin-clavulanate 875-125 MG tablet Commonly known as: AUGMENTIN Take 1 tablet by mouth 2 (two) times daily for 3 days. Start day prior to follow up appt for catheter removal   docusate sodium 100 MG capsule Commonly known as: COLACE Take 1 capsule (100 mg total) by mouth 2 (two) times daily.   finasteride 5 MG tablet Commonly known as: PROSCAR Take 5 mg by mouth daily.   HYDROcodone-acetaminophen 5-325 MG tablet Commonly known as: NORCO/VICODIN Take 1-2 tablets by mouth every 6 (six) hours as needed for moderate pain (pain score 4-6) or severe pain (pain score 7-10).   losartan 25 MG tablet Commonly known as: COZAAR Take 2 tablets (50 mg total) by mouth daily. What changed:  how much to take additional instructions   rosuvastatin 10 MG tablet Commonly known as: CRESTOR Take 10 mg by mouth at bedtime.   Tadalafil 2.5 MG Tabs Take 1 tablet by mouth daily.        Followup:   Follow-up Information     Berneice Heinrich Delbert Phenix., MD Follow up on 05/14/2023.   Specialty: Urology Why: at 10 AM for MD visit, pathology review, and catheter removal. Contact information: [address] [place] [address] [phone]

## 2023-05-03 NOTE — Progress Notes (Signed)
Patient to be discharged to home this afternoon.All discharge teaching/instructions including all discharge Medications and schedules for these Medications reviewed with the Patient and Patient's Wife. Home care for post op Prostatectomy reviewed with the Patient and Patient's Wife. Understanding verbalized and discharge AVS with the Patietn at time of discharge

## 2023-05-03 NOTE — TOC Initial Note (Signed)
Transition of Care [facility]) - Initial/Assessment Note    Patient Details  Name: [name] MRN: [id number] Date of Birth: [birth year]  [facility] CM/SW Contact:    Adrian Prows, RN Phone Number: 05/03/2023, 10:35 AM  Clinical Narrative:                 Spoke w/ pt and wife [name] [phone]) in room; pt says he lives at home with his wife; he plans to return at d/c; pt verified PCP/insurance; he denies SDOH risks; his wife will provide transportation; pt says he does not have DME, HH services, or home oxygen; no TOC needs.  Expected Discharge Plan: Home/Self Care Barriers to Discharge: No Barriers Identified   Patient Goals and CMS Choice Patient states their goals for this hospitalization and ongoing recovery are:: home          Expected Discharge Plan and Services   Discharge Planning Services: CM Consult Post Acute Care Choice: NA Living arrangements for the past 2 months: Single Family Home Expected Discharge Date: 05/03/23               DME Arranged: N/A DME Agency: NA       HH Arranged: NA HH Agency: NA        Prior Living Arrangements/Services Living arrangements for the past 2 months: Single Family Home Lives with:: Spouse Patient language and need for interpreter reviewed:: Yes        Need for Family Participation in Patient Care: Yes (Comment) Care giver support system in place?: Yes (comment) Current home services:  (n/a) Criminal Activity/Legal Involvement Pertinent to Current Situation/Hospitalization: No - Comment as needed  Activities of Daily Living   ADL Screening (condition at time of admission) Independently performs ADLs?: Yes (appropriate for developmental age) Is the patient deaf or have difficulty hearing?: No Does the patient have difficulty seeing, even when wearing glasses/contacts?: Yes Does the patient have difficulty concentrating, remembering, or making decisions?: No  Permission  Sought/Granted Permission sought to share information with : Case Manager Permission granted to share information with : Yes, Verbal Permission Granted  Share Information with NAME: Case Manager     Permission granted to share info w Relationship: Evelyn Moch (spouse) [phone]     Emotional Assessment Appearance:: Appears stated age Attitude/Demeanor/Rapport: Gracious Affect (typically observed): Accepting Orientation: : Oriented to Self, Oriented to Place, Oriented to  Time, Oriented to Situation Alcohol / Substance Use: Not Applicable Psych Involvement: No (comment)  Admission diagnosis:  BPH with obstruction/lower urinary tract symptoms [N40.1, N13.8] Patient Active Problem List   Diagnosis Date Noted   BPH with obstruction/lower urinary tract symptoms 05/02/2023   Catheter-associated urinary tract infection (HCC) 04/11/2023   Hyperlipidemia 04/11/2023   Sleep apnea    Hypertension    Gross hematuria 03/09/2023   PCP:  [facility] Pharmacy:   [facility] [phone] - [place], [place] - [address] [place] [address] Phone: [phone] Fax: [phone]     Social Drivers of Health (SDOH) Social History: SDOH Screenings   Food Insecurity: No Food Insecurity (05/03/2023)  Housing: Low Risk  (05/03/2023)  Transportation Needs: No Transportation Needs (05/03/2023)  Utilities: Not At Risk (05/03/2023)  Social Connections: Unknown (07/30/2021)   Received from [facility]  Tobacco Use: Low Risk  (05/02/2023)   SDOH Interventions: Food Insecurity Interventions: Intervention Not Indicated, Inpatient TOC Housing Interventions: Intervention Not Indicated, Inpatient  TOC Transportation Interventions: Intervention Not Indicated, Inpatient TOC Utilities Interventions: Intervention Not Indicated, Inpatient TOC   Readmission Risk Interventions     No data to display

## 2023-05-03 NOTE — Anesthesia Postprocedure Evaluation (Signed)
Anesthesia Post Note  Patient: [name]  Procedure(s) Performed: XI ROBOTIC ASSISTED SIMPLE PROSTATECTOMY     Patient location during evaluation: PACU Anesthesia Type: General Level of consciousness: awake and alert Pain management: pain level controlled Vital Signs Assessment: post-procedure vital signs reviewed and stable Respiratory status: spontaneous breathing, nonlabored ventilation, respiratory function stable and patient connected to nasal cannula oxygen Cardiovascular status: blood pressure returned to baseline and stable Postop Assessment: no apparent nausea or vomiting Anesthetic complications: no   No notable events documented.           Mariann Barter

## 2023-05-04 LAB — SURGICAL PATHOLOGY

## 2023-07-26 ENCOUNTER — Encounter: Payer: Self-pay | Admitting: Podiatry

## 2023-07-26 ENCOUNTER — Ambulatory Visit (INDEPENDENT_AMBULATORY_CARE_PROVIDER_SITE_OTHER): Admitting: Podiatry

## 2023-07-26 DIAGNOSIS — L600 Ingrowing nail: Secondary | ICD-10-CM | POA: Diagnosis not present

## 2023-07-26 NOTE — Progress Notes (Signed)
  Subjective:  Patient ID: [name], male    DOB: 1965/06/02,   MRN: [id number]  No chief complaint on file.   58 y.o. male presents for concern of reoccurrence of right ingrown toenail. States it has been bothering him for a while and has tried trimming again which helps some but keeps acting up and hurting. Wondering about repeat procedure.  Denies any other pedal complaints. Denies n/v/f/c.   Past Medical History:  Diagnosis Date   Anemia    History of kidney stones    Hypertension    Sleep apnea    does not use cpap currently    Objective:  Physical Exam: Vascular: DP/PT pulses 2/4 bilateral. CFT <3 seconds. Normal hair growth on digits. No edema.  Skin. No lacerations or abrasions bilateral feet. Left hallux nail healing well. Right hallux with medial and lateral border incurvation and tenderness to medial border. Mild edema noted no erythema.  Musculoskeletal: MMT 5/5 bilateral lower extremities in DF, PF, Inversion and Eversion. Deceased ROM in DF of ankle joint.  Neurological: Sensation intact to light touch.   Assessment:   1. Ingrown right greater toenail       Plan:  Patient was evaluated and treated and all questions answered. Patient requesting removal of ingrown nail today. Procedure below.  Discussed procedure and post procedure care and patient expressed understanding.  Will follow-up in 2 weeks for nail check or sooner if any problems arise.    Procedure:  Procedure: partial Nail Avulsion of right hallux bilateral nail border.  Surgeon: Jennefer Moats, DPM  Pre-op Dx: Ingrown toenail without infection Post-op: Same  Place of Surgery: Office exam room.  Indications for surgery: Painful and ingrown toenail.    The patient is requesting removal of nail with chemical matrixectomy. Risks and complications were discussed with the patient for which they understand and written consent was obtained. Under sterile conditions a total of 3 mL of  1% lidocaine   plain was infiltrated in a hallux block fashion. Once anesthetized, the skin was prepped in sterile fashion. A tourniquet was then applied. Next the bilateral aspect of hallux nail border was then sharply excised making sure to remove the entire offending nail border.  Next phenol was then applied under standard conditions and copiously irrigated. Silvadene was applied. A dry sterile dressing was applied. After application of the dressing the tourniquet was removed and there is found to be an immediate capillary refill time to the digit. The patient tolerated the procedure well without any complications. Post procedure instructions were discussed the patient for which he verbally understood. Follow-up in two weeks for nail check or sooner if any problems are to arise. Discussed signs/symptoms of infection and directed to call the office immediately should any occur or go directly to the emergency room. In the meantime, encouraged to call the office with any questions, concerns, changes symptoms.   Jennefer Moats, DPM

## 2023-07-26 NOTE — Patient Instructions (Signed)
